# Patient Record
Sex: Female | Born: 1985 | Race: White | Hispanic: No | Marital: Married | Smoking: Never smoker
Health system: Southern US, Community
[De-identification: ages and names within clinical notes are randomized; demographics above are authoritative.]

## PROBLEM LIST (undated history)

## (undated) DIAGNOSIS — Z8041 Family history of malignant neoplasm of ovary: Secondary | ICD-10-CM

## (undated) DIAGNOSIS — Z789 Other specified health status: Secondary | ICD-10-CM

## (undated) DIAGNOSIS — Z9289 Personal history of other medical treatment: Secondary | ICD-10-CM

## (undated) HISTORY — DX: Family history of malignant neoplasm of ovary: Z80.41

## (undated) HISTORY — PX: TONSILLECTOMY: SUR1361

## (undated) HISTORY — PX: FOREIGN BODY REMOVAL: SHX962

---

## 2019-06-21 ENCOUNTER — Encounter: Payer: Self-pay | Admitting: Maternal Newborn

## 2019-06-21 ENCOUNTER — Ambulatory Visit (INDEPENDENT_AMBULATORY_CARE_PROVIDER_SITE_OTHER): Payer: BC Managed Care – PPO | Admitting: Maternal Newborn

## 2019-06-21 ENCOUNTER — Other Ambulatory Visit (HOSPITAL_COMMUNITY)
Admission: RE | Admit: 2019-06-21 | Discharge: 2019-06-21 | Disposition: A | Discharge status: Home / Self Care | Payer: BC Managed Care – PPO | Source: Ambulatory Visit | Attending: Maternal Newborn | Admitting: Maternal Newborn

## 2019-06-21 ENCOUNTER — Other Ambulatory Visit: Payer: Self-pay

## 2019-06-21 ENCOUNTER — Other Ambulatory Visit: Payer: Self-pay | Admitting: Maternal Newborn

## 2019-06-21 VITALS — BP 120/80 | Ht 63.0 in | Wt 169.0 lb

## 2019-06-21 DIAGNOSIS — Z124 Encounter for screening for malignant neoplasm of cervix: Secondary | ICD-10-CM | POA: Diagnosis present

## 2019-06-21 DIAGNOSIS — Z113 Encounter for screening for infections with a predominantly sexual mode of transmission: Secondary | ICD-10-CM | POA: Diagnosis present

## 2019-06-21 DIAGNOSIS — Z3201 Encounter for pregnancy test, result positive: Secondary | ICD-10-CM | POA: Diagnosis not present

## 2019-06-21 DIAGNOSIS — Z3401 Encounter for supervision of normal first pregnancy, first trimester: Secondary | ICD-10-CM | POA: Diagnosis not present

## 2019-06-21 DIAGNOSIS — Z3A08 8 weeks gestation of pregnancy: Secondary | ICD-10-CM | POA: Diagnosis not present

## 2019-06-21 DIAGNOSIS — Z3400 Encounter for supervision of normal first pregnancy, unspecified trimester: Secondary | ICD-10-CM | POA: Insufficient documentation

## 2019-06-21 DIAGNOSIS — Z369 Encounter for antenatal screening, unspecified: Secondary | ICD-10-CM

## 2019-06-21 LAB — POCT URINE PREGNANCY: Preg Test, Ur: POSITIVE — AB

## 2019-06-21 NOTE — Progress Notes (Signed)
06/21/2019   Chief Complaint: Desires prenatal care.  Transfer of Care Patient: no  History of Present Illness: Mia Williams is a 33 y.o. G1P0 at 63w5dbased on Patient's last menstrual period on 04/21/2019, with an Estimated Date of Delivery: 01/26/2020, with the above CC.   Her periods were: regular periods every month, lasting 5-7 days She has Positive signs or symptoms of nausea/vomiting of pregnancy. She has Negative signs or symptoms of miscarriage or preterm labor She identifies Negative Zika risk factors for her and her partner On any different medications around the time she conceived/early pregnancy: No  History of varicella: Yes   Review of Systems  Constitutional:       Change in appetite  HENT: Negative.   Eyes: Negative.   Respiratory: Negative for shortness of breath and wheezing.   Cardiovascular: Negative for chest pain and palpitations.  Gastrointestinal: Positive for nausea. Negative for abdominal pain and vomiting.  Genitourinary: Positive for dysuria and frequency. Negative for flank pain and hematuria.  Musculoskeletal: Negative.   Skin: Negative.   Neurological: Positive for headaches.       Tension headaches, more frequent now in pregnancy  Endo/Heme/Allergies: Negative.   Psychiatric/Behavioral: Negative.   Breasts: Positive for breast tenderness. Negative for new or changing breast lumps.  Review of systems was otherwise negative, except as stated in the above HPI.  OBGYN History: As per HPI. OB History  Gravida Para Term Preterm AB Living  1            SAB TAB Ectopic Multiple Live Births               # Outcome Date GA Lbr Len/2nd Weight Sex Delivery Anes PTL Lv  1 Current             Any issues with any prior pregnancies: not applicable Any prior children are healthy, doing well, without any problems or issues: not applicable History of pap smears: Yes. Last pap smear: unknown. Abnormal: no  History of STIs: No   Past Medical History: History  reviewed. No pertinent past medical history.  Past Surgical History: History reviewed. No pertinent surgical history.  Family History:  Family History  Problem Relation Age of Onset  . Ovarian cancer Paternal Grandmother   . Pancreatic cancer Paternal Grandfather    She denies any female cancers, bleeding or blood clotting disorders.  She denies any history of intellectual disability, birth defects or genetic disorders in her or the FOB's history  Social History:  Social History   Socioeconomic History  . Marital status: Single    Spouse name: Not on file  . Number of children: Not on file  . Years of education: Not on file  . Highest education level: Not on file  Occupational History  . Not on file  Social Needs  . Financial resource strain: Not on file  . Food insecurity    Worry: Not on file    Inability: Not on file  . Transportation needs    Medical: Not on file    Non-medical: Not on file  Tobacco Use  . Smoking status: Never Smoker  . Smokeless tobacco: Never Used  Substance and Sexual Activity  . Alcohol use: Not Currently  . Drug use: Never  . Sexual activity: Yes  Lifestyle  . Physical activity    Days per week: Not on file    Minutes per session: Not on file  . Stress: Not on file  Relationships  . Social  connections    Talks on phone: Not on file    Gets together: Not on file    Attends religious service: Not on file    Active member of club or organization: Not on file    Attends meetings of clubs or organizations: Not on file    Relationship status: Not on file  . Intimate partner violence    Fear of current or ex partner: Not on file    Emotionally abused: Not on file    Physically abused: Not on file    Forced sexual activity: Not on file  Other Topics Concern  . Not on file  Social History Narrative  . Not on file   Any cats in the household: no Domestic violence screening is negative.  Allergy: No Known Allergies  Current  Outpatient Medications: No current outpatient medications on file.   Physical Exam:   BP 120/80   Ht 5' 3"  (1.6 m)   Wt 169 lb (76.7 kg)   LMP 04/21/2019   BMI 29.94 kg/m  Body mass index is 29.94 kg/m. Constitutional: Well nourished, well developed female in no acute distress.  Neck:  Supple, normal appearance, and no thyromegaly  Cardiovascular: S1, S2 normal, no murmur, rub or gallop, regular rate and rhythm Respiratory:  Clear to auscultation bilaterally. Normal respiratory effort Abdomen:Mo masses, hernias; diffusely non tender to palpation, non distended Breasts: Patient declines to have breast exam. Neuro/Psych:  Normal mood and affect.  Skin:  Warm and dry.  Lymphatic:  No inguinal lymphadenopathy.   Pelvic exam: is not limited by body habitus External genitalia, Bartholin's glands, Urethra, Skene's glands: within normal limits Vagina: within normal limits and with no blood in the vault  Cervix: normal appearing cervix without discharge or lesions, closed/long/high Uterus:  enlarged, c/w early pregnancy Adnexa:  no mass, fullness, tenderness  Assessment: Mia Williams is a 33 y.o. G1P0 at 23w5dbased on Patient's last menstrual period on 04/21/2019. with an Estimated Date of Delivery: 01/26/2020, presenting for prenatal care.  Plan:  1) Avoid alcoholic beverages. 2) Patient encouraged not to smoke.  3) Discontinue the use of all non-medicinal drugs and chemicals.  4) Take prenatal vitamins daily.  5) Seatbelt use advised. 6) Nutrition, food safety (fish, cheese advisories, and high nitrite foods) and exercise discussed. 7) [facility]  8) Patient is asked about travel to areas at risk for the ZSegundovirus, and counseled to avoid travel and exposure to mosquitoes or partners who may have themselves been exposed to the virus.  9) [facility]. 10) Genetic Screening, such as with 1st Trimester Screening, cell  free fetal DNA, AFP testing, and Ultrasound is discussed with patient. She is considering genetic testing this pregnancy. 11) Discussed Diclegis/Bonjesta for daily, all-day nausea. Prefers to try Unisom and B6, written information given about dosage. 12) Prenatal vitamin samples given. 13) Pap done today. 14) Dating scan ordered. 17 Myriad testing offered for family history of ovarian cancer in her paternal grandmother. Written information given, and she is deciding whether to test.  Problem list reviewed and updated.  JAvel Sensor CNM [facility] 06/21/2019  11:53 AM

## 2019-06-21 NOTE — Addendum Note (Signed)
Addended by: Quintella Baton D on: 06/21/2019 01:36 PM   Modules accepted: Orders

## 2019-06-21 NOTE — Patient Instructions (Signed)
First Trimester of Pregnancy The first trimester of pregnancy is from week 1 until the end of week 13 (months 1 through 3). A week after a sperm fertilizes an egg, the egg will implant on the wall of the uterus. This embryo will begin to develop into a baby. Genes from you and your partner will form the baby. The female genes will determine whether the baby will be a boy or a girl. At 6-8 weeks, the eyes and face will be formed, and the heartbeat can be seen on ultrasound. At the end of 12 weeks, all the baby's organs will be formed. Now that you are pregnant, you will want to do everything you can to have a healthy baby. Two of the most important things are to get good prenatal care and to follow your health care provider's instructions. Prenatal care is all the medical care you receive before the baby's birth. This care will help prevent, find, and treat any problems during the pregnancy and childbirth. Body changes during your first trimester Your body goes through many changes during pregnancy. The changes vary from woman to woman.  You may gain or lose a couple of pounds at first.  You may feel sick to your stomach (nauseous) and you may throw up (vomit). If the vomiting is uncontrollable, call your health care provider.  You may tire easily.  You may develop headaches that can be relieved by medicines. All medicines should be approved by your health care provider.  You may urinate more often. Painful urination may mean you have a bladder infection.  You may develop heartburn as a result of your pregnancy.  You may develop constipation because certain hormones are causing the muscles that push stool through your intestines to slow down.  You may develop hemorrhoids or swollen veins (varicose veins).  Your breasts may begin to grow larger and become tender. Your nipples may stick out more, and the tissue that surrounds them (areola) may become darker.  Your gums may bleed and may be  sensitive to brushing and flossing.  Dark spots or blotches (chloasma, mask of pregnancy) may develop on your face. This will likely fade after the baby is born.  Your menstrual periods will stop.  You may have a loss of appetite.  You may develop cravings for certain kinds of food.  You may have changes in your emotions from day to day, such as being excited to be pregnant or being concerned that something may go wrong with the pregnancy and baby.  You may have more vivid and strange dreams.  You may have changes in your hair. These can include thickening of your hair, rapid growth, and changes in texture. Some women also have hair loss during or after pregnancy, or hair that feels dry or thin. Your hair will most likely return to normal after your baby is born. What to expect at prenatal visits During a routine prenatal visit:  You will be weighed to make sure you and the baby are growing normally.  Your blood pressure will be taken.  Your abdomen will be measured to track your baby's growth.  The fetal heartbeat will be listened to between weeks 10 and 14 of your pregnancy.  Test results from any previous visits will be discussed. Your health care provider may ask you:  How you are feeling.  If you are feeling the baby move.  If you have had any abnormal symptoms, such as leaking fluid, bleeding, severe headaches, or abdominal   cramping.  If you are using any tobacco products, including cigarettes, chewing tobacco, and electronic cigarettes.  If you have any questions. Other tests that may be performed during your first trimester include:  Blood tests to find your blood type and to check for the presence of any previous infections. The tests will also be used to check for low iron levels (anemia) and protein on red blood cells (Rh antibodies). Depending on your risk factors, or if you previously had diabetes during pregnancy, you may have tests to check for high blood sugar  that affects pregnant women (gestational diabetes).  Urine tests to check for infections, diabetes, or protein in the urine.  An ultrasound to confirm the proper growth and development of the baby.  Fetal screens for spinal cord problems (spina bifida) and Down syndrome.  HIV (human immunodeficiency virus) testing. Routine prenatal testing includes screening for HIV, unless you choose not to have this test.  You may need other tests to make sure you and the baby are doing well. Follow these instructions at home: Medicines  Follow your health care provider's instructions regarding medicine use. Specific medicines may be either safe or unsafe to take during pregnancy.  Take a prenatal vitamin that contains at least 600 micrograms (mcg) of folic acid.  If you develop constipation, try taking a stool softener if your health care provider approves. Eating and drinking   Eat a balanced diet that includes fresh fruits and vegetables, whole grains, good sources of protein such as meat, eggs, or tofu, and low-fat dairy. Your health care provider will help you determine the amount of weight gain that is right for you.  Avoid raw meat and uncooked cheese. These carry germs that can cause birth defects in the baby.  Eating four or five small meals rather than three large meals a day may help relieve nausea and vomiting. If you start to feel nauseous, eating a few soda crackers can be helpful. Drinking liquids between meals, instead of during meals, also seems to help ease nausea and vomiting.  Limit foods that are high in fat and processed sugars, such as fried and sweet foods.  To prevent constipation: ? Eat foods that are high in fiber, such as fresh fruits and vegetables, whole grains, and beans. ? Drink enough fluid to keep your urine clear or pale yellow. Activity  Exercise only as directed by your health care provider. Most women can continue their usual exercise routine during  pregnancy. Try to exercise for 30 minutes at least 5 days a week. Exercising will help you: ? Control your weight. ? Stay in shape. ? Be prepared for labor and delivery.  Experiencing pain or cramping in the lower abdomen or lower back is a good sign that you should stop exercising. Check with your health care provider before continuing with normal exercises.  Try to avoid standing for long periods of time. Move your legs often if you must stand in one place for a long time.  Avoid heavy lifting.  Wear low-heeled shoes and practice good posture.  You may continue to have sex unless your health care provider tells you not to. Relieving pain and discomfort  Wear a good support bra to relieve breast tenderness.  Take warm sitz baths to soothe any pain or discomfort caused by hemorrhoids. Use hemorrhoid cream if your health care provider approves.  Rest with your legs elevated if you have leg cramps or low back pain.  If you develop varicose veins in   your legs, wear support hose. Elevate your feet for 15 minutes, 3-4 times a day. Limit salt in your diet. Prenatal care  Schedule your prenatal visits by the twelfth week of pregnancy. They are usually scheduled monthly at first, then more often in the last 2 months before delivery.  Write down your questions. Take them to your prenatal visits.  Keep all your prenatal visits as told by your health care provider. This is important. Safety  Wear your seat belt at all times when driving.  Make a list of emergency phone numbers, including numbers for family, friends, the hospital, and police and fire departments. General instructions  Ask your health care provider for a referral to a local prenatal education class. Begin classes no later than the beginning of month 6 of your pregnancy.  Ask for help if you have counseling or nutritional needs during pregnancy. Your health care provider can offer advice or refer you to specialists for help  with various needs.  Do not use hot tubs, steam rooms, or saunas.  Do not douche or use tampons or scented sanitary pads.  Do not cross your legs for long periods of time.  Avoid cat litter boxes and soil used by cats. These carry germs that can cause birth defects in the baby and possibly loss of the fetus by miscarriage or stillbirth.  Avoid all smoking, herbs, alcohol, and medicines not prescribed by your health care provider. Chemicals in these products affect the formation and growth of the baby.  Do not use any products that contain nicotine or tobacco, such as cigarettes and e-cigarettes. If you need help quitting, ask your health care provider. You may receive counseling support and other resources to help you quit.  Schedule a dentist appointment. At home, brush your teeth with a soft toothbrush and be gentle when you floss. Contact a health care provider if:  You have dizziness.  You have mild pelvic cramps, pelvic pressure, or nagging pain in the abdominal area.  You have persistent nausea, vomiting, or diarrhea.  You have a bad smelling vaginal discharge.  You have pain when you urinate.  You notice increased swelling in your face, hands, legs, or ankles.  You are exposed to fifth disease or chickenpox.  You are exposed to German measles (rubella) and have never had it. Get help right away if:  You have a fever.  You are leaking fluid from your vagina.  You have spotting or bleeding from your vagina.  You have severe abdominal cramping or pain.  You have rapid weight gain or loss.  You vomit blood or material that looks like coffee grounds.  You develop a severe headache.  You have shortness of breath.  You have any kind of trauma, such as from a fall or a car accident. Summary  The first trimester of pregnancy is from week 1 until the end of week 13 (months 1 through 3).  Your body goes through many changes during pregnancy. The changes vary from  woman to woman.  You will have routine prenatal visits. During those visits, your health care provider will examine you, discuss any test results you may have, and talk with you about how you are feeling. This information is not intended to replace advice given to you by your health care provider. Make sure you discuss any questions you have with your health care provider. Document Released: 11/17/2001 Document Revised: 11/05/2017 Document Reviewed: 11/04/2016 Elsevier Patient Education  2020 [employer]  

## 2019-06-22 LAB — RPR+RH+ABO+RUB AB+AB SCR+CB...
Antibody Screen: NEGATIVE
HIV Screen 4th Generation wRfx: NONREACTIVE
Hematocrit: 37.2 % (ref 34.0–46.6)
Hemoglobin: 12.4 g/dL (ref 11.1–15.9)
Hepatitis B Surface Ag: NEGATIVE
MCH: 29.4 pg (ref 26.6–33.0)
MCHC: 33.3 g/dL (ref 31.5–35.7)
MCV: 88 fL (ref 79–97)
Platelets: 231 10*3/uL (ref 150–450)
RBC: 4.22 x10E6/uL (ref 3.77–5.28)
RDW: 13.2 % (ref 11.7–15.4)
RPR Ser Ql: NONREACTIVE
Rh Factor: POSITIVE
Rubella Antibodies, IGG: 1.74 index (ref >0.99)
Varicella zoster IgG: >4000 index (ref >165)
WBC: 8.2 10*3/uL (ref 3.4–10.8)

## 2019-06-22 LAB — URINE DRUG PANEL 7
Amphetamines, Urine: NEGATIVE ng/mL
Barbiturate Quant, Ur: NEGATIVE ng/mL
Benzodiazepine Quant, Ur: NEGATIVE ng/mL
Cannabinoid Quant, Ur: NEGATIVE ng/mL
Cocaine (Metab.): NEGATIVE ng/mL
Opiate Quant, Ur: NEGATIVE ng/mL
PCP Quant, Ur: NEGATIVE ng/mL

## 2019-06-23 LAB — URINE CULTURE

## 2019-06-23 LAB — CYTOLOGY - PAP
Chlamydia: NEGATIVE
Diagnosis: NEGATIVE
HPV: NOT DETECTED
Neisseria Gonorrhea: NEGATIVE

## 2019-06-29 ENCOUNTER — Other Ambulatory Visit: Payer: Self-pay

## 2019-06-29 ENCOUNTER — Ambulatory Visit (INDEPENDENT_AMBULATORY_CARE_PROVIDER_SITE_OTHER): Payer: BC Managed Care – PPO

## 2019-06-29 ENCOUNTER — Ambulatory Visit (INDEPENDENT_AMBULATORY_CARE_PROVIDER_SITE_OTHER): Payer: BC Managed Care – PPO | Admitting: Certified Nurse Midwife

## 2019-06-29 VITALS — BP 130/80 | Wt 169.0 lb

## 2019-06-29 DIAGNOSIS — N8311 Corpus luteum cyst of right ovary: Secondary | ICD-10-CM

## 2019-06-29 DIAGNOSIS — Z369 Encounter for antenatal screening, unspecified: Secondary | ICD-10-CM

## 2019-06-29 DIAGNOSIS — O26841 Uterine size-date discrepancy, first trimester: Secondary | ICD-10-CM | POA: Diagnosis not present

## 2019-06-29 DIAGNOSIS — Z3A01 Less than 8 weeks gestation of pregnancy: Secondary | ICD-10-CM | POA: Diagnosis not present

## 2019-06-29 DIAGNOSIS — O3481 Maternal care for other abnormalities of pelvic organs, first trimester: Secondary | ICD-10-CM

## 2019-06-29 DIAGNOSIS — Z3400 Encounter for supervision of normal first pregnancy, unspecified trimester: Secondary | ICD-10-CM

## 2019-06-29 NOTE — Progress Notes (Signed)
33 year old G1 P0 with EDC=01/25/2019 presents at [facility] after dating scan. CRL c/w [id number]. No FCA seen. Patient reports + pregnancy test 3 weeks ago. Menses usually monthly Advised that she is either not as far along as thought, or that there is an intrauterine fetal demise. Will get another viability scan for 10-14 days and follow up at that time SAB precautions  Mia Williams, CNM

## 2019-07-05 ENCOUNTER — Telehealth: Payer: Self-pay

## 2019-07-05 DIAGNOSIS — O209 Hemorrhage in early pregnancy, unspecified: Secondary | ICD-10-CM

## 2019-07-05 NOTE — Telephone Encounter (Signed)
Called and left message. Will call tomorrow to schedule an earlier ultrasound. Advised to go to ER with heavy bleeding of needing 3 pads or more an hour for 2 hours.

## 2019-07-05 NOTE — Telephone Encounter (Signed)
Pt was seen 06/29/2019 & notified she is either much earlier than anticipated or it was not a viable pregnancy. She has a f/u on 07/13/2019 & was to call if she had any further bleeding. She started bleeding last night. It has gotten heavier throughout today. She's sure this means it is a miscarriage. She wanted to make [place] aware & see what her next steps should be. PJ#[phone]

## 2019-07-06 NOTE — Telephone Encounter (Signed)
Called patient today. Started bleeding like a heavy period with cramping 2 days ago, bleeding has slowed down. Will schedule for an ultrasound and follow up with me tomorrow. Dalia Heading, CNM

## 2019-07-07 ENCOUNTER — Encounter: Payer: Self-pay | Admitting: Emergency Medicine

## 2019-07-07 ENCOUNTER — Other Ambulatory Visit: Payer: Self-pay

## 2019-07-07 ENCOUNTER — Emergency Department
Admission: EM | Admit: 2019-07-07 | Discharge: 2019-07-07 | Disposition: A | Discharge status: Home / Self Care | Payer: BC Managed Care – PPO | Attending: Emergency Medicine | Admitting: Emergency Medicine

## 2019-07-07 ENCOUNTER — Other Ambulatory Visit: Payer: BC Managed Care – PPO

## 2019-07-07 ENCOUNTER — Emergency Department: Payer: BC Managed Care – PPO

## 2019-07-07 ENCOUNTER — Encounter: Payer: BC Managed Care – PPO | Admitting: Certified Nurse Midwife

## 2019-07-07 DIAGNOSIS — O034 Incomplete spontaneous abortion without complication: Secondary | ICD-10-CM | POA: Insufficient documentation

## 2019-07-07 DIAGNOSIS — O3680X0 Pregnancy with inconclusive fetal viability, not applicable or unspecified: Secondary | ICD-10-CM

## 2019-07-07 DIAGNOSIS — O209 Hemorrhage in early pregnancy, unspecified: Secondary | ICD-10-CM | POA: Diagnosis present

## 2019-07-07 LAB — CBC WITH DIFFERENTIAL/PLATELET
Abs Immature Granulocytes: 0.06 10*3/uL (ref 0.00–0.07)
Basophils Absolute: 0.1 10*3/uL (ref 0.0–0.1)
Basophils Relative: 0 %
Eosinophils Absolute: 0.1 10*3/uL (ref 0.0–0.5)
Eosinophils Relative: 1 %
HCT: 36.3 % (ref 36.0–46.0)
Hemoglobin: 11.9 g/dL — ABNORMAL LOW (ref 12.0–15.0)
Immature Granulocytes: 0 %
Lymphocytes Relative: 12 %
Lymphs Abs: 1.7 10*3/uL (ref 0.7–4.0)
MCH: 29.8 pg (ref 26.0–34.0)
MCHC: 32.8 g/dL (ref 30.0–36.0)
MCV: 90.8 fL (ref 80.0–100.0)
Monocytes Absolute: 0.5 10*3/uL (ref 0.1–1.0)
Monocytes Relative: 4 %
Neutro Abs: 11.7 10*3/uL — ABNORMAL HIGH (ref 1.7–7.7)
Neutrophils Relative %: 83 %
Platelets: 218 10*3/uL (ref 150–400)
RBC: 4 MIL/uL (ref 3.87–5.11)
RDW: 13.3 % (ref 11.5–15.5)
WBC: 14.1 10*3/uL — ABNORMAL HIGH (ref 4.0–10.5)
nRBC: 0 % (ref 0.0–0.2)

## 2019-07-07 LAB — BASIC METABOLIC PANEL
Anion gap: 10 (ref 5–15)
BUN: 14 mg/dL (ref 6–20)
CO2: 20 mmol/L — ABNORMAL LOW (ref 22–32)
Calcium: 8.8 mg/dL — ABNORMAL LOW (ref 8.9–10.3)
Chloride: 110 mmol/L (ref 98–111)
Creatinine, Ser: 0.7 mg/dL (ref 0.44–1.00)
GFR calc Af Amer: >60 mL/min (ref >60)
GFR calc non Af Amer: >60 mL/min (ref >60)
Glucose, Bld: 122 mg/dL — ABNORMAL HIGH (ref 70–99)
Potassium: 3.5 mmol/L (ref 3.5–5.1)
Sodium: 140 mmol/L (ref 135–145)

## 2019-07-07 LAB — TYPE AND SCREEN
ABO/RH(D): A POS
Antibody Screen: NEGATIVE

## 2019-07-07 LAB — HCG, QUANTITATIVE, PREGNANCY: hCG, Beta Chain, Quant, S: 6099 m[IU]/mL — ABNORMAL HIGH (ref <5)

## 2019-07-07 MED ORDER — MISOPROSTOL 200 MCG PO TABS: ORAL_TABLET | ORAL | 0 refills | Status: DC

## 2019-07-07 MED ORDER — MISOPROSTOL 200 MCG PO TABS: 800.0000 ug | ORAL_TABLET | Freq: Four times a day (QID) | ORAL | 0 refills | Status: DC

## 2019-07-07 MED ORDER — ONDANSETRON HCL 4 MG/2ML IJ SOLN
4.0000 mg | Freq: Once | INTRAMUSCULAR | Status: AC
  Administered 2019-07-07: 11:00:00 4 mg via INTRAVENOUS
  Filled 2019-07-07: qty 2

## 2019-07-07 MED ORDER — OXYCODONE-ACETAMINOPHEN 5-325 MG PO TABS: 1.0000 | ORAL_TABLET | ORAL | 0 refills | Status: AC | PRN

## 2019-07-07 MED ORDER — MORPHINE SULFATE (PF) 4 MG/ML IV SOLN
4.0000 mg | Freq: Once | INTRAVENOUS | Status: AC
  Administered 2019-07-07: 11:00:00 4 mg via INTRAVENOUS
  Filled 2019-07-07: qty 1

## 2019-07-07 NOTE — ED Triage Notes (Signed)
Pt to ED via POV, pt states that she is miscarrying and is having increased pain and vaginal bleeding. Pt states that she has used 3 pads since this morning around 0530. Pt appears uncomfortable in triage.

## 2019-07-07 NOTE — ED Provider Notes (Signed)
[facility] Emergency Department Provider Note ____________________________________________   First MD Initiated Contact with Patient 07/07/19 1045     (approximate)  I have reviewed the triage vital signs and the nursing notes.   HISTORY  Chief Complaint Abdominal Pain and Vaginal Bleeding    HPI Mia Williams is a 33 y.o. female G1P0 at approximately 9 weeks pregnancy by dates with PMH as noted below who presents with vaginal bleeding over the last 2 days, acutely worsened this morning, requiring her to use 4 pads in the span of approximately 4 hours.  She reports bilateral suprapubic area lower abdominal pain and some associated nausea and vomiting.   History reviewed. No pertinent past medical history.  Patient Active Problem List   Diagnosis Date Noted  . Supervision of normal first pregnancy, antepartum 06/21/2019    History reviewed. No pertinent surgical history.  Prior to Admission medications   Medication Sig Start Date End Date Taking? Authorizing Provider  misoprostol (CYTOTEC) 200 MCG tablet 800mcg (4 tablets) buccally every 6 hours for up to 3 doses 07/07/19   Dionne BucySiadecki, Renelle Stegenga, MD  oxyCODONE-acetaminophen (PERCOCET) 5-325 MG tablet Take 1 tablet by mouth every 4 (four) hours as needed for up to 3 days for severe pain. 07/07/19 07/10/19  Dionne BucySiadecki, Niya Behler, MD    Allergies Patient has no known allergies.  Family History  Problem Relation Age of Onset  . Ovarian cancer Paternal Grandmother   . Pancreatic cancer Paternal Grandfather     Social History Social History   Tobacco Use  . Smoking status: Never Smoker  . Smokeless tobacco: Never Used  Substance Use Topics  . Alcohol use: Not Currently  . Drug use: Never    Review of Systems  Constitutional: No fever. Eyes: No redness. ENT: No sore throat. Cardiovascular: Denies chest pain. Respiratory: Denies shortness of breath. Gastrointestinal: Positive for nausea  vomiting. Genitourinary: Positive for vaginal bleeding. Musculoskeletal: Positive for back pain. Skin: Negative for rash. Neurological: Negative for headache.   ____________________________________________   PHYSICAL EXAM:  VITAL SIGNS: ED Triage Vitals  Enc Vitals Group     BP 07/07/19 0955 110/64     Pulse Rate 07/07/19 0955 (!) 59     Resp 07/07/19 0955 (!) 22     Temp --      Temp Source 07/07/19 0955 Oral     SpO2 07/07/19 0955 99 %     Weight 07/07/19 0958 165 lb (74.8 kg)     Height 07/07/19 0958 5\' 3"  (1.6 m)     Head Circumference --      Peak Flow --      Pain Score 07/07/19 0957 10     Pain Loc --      Pain Edu? --      Excl. in GC? --     Constitutional: Alert and oriented.  Uncomfortable appearing but in no acute distress. Eyes: Conjunctivae are normal.  Head: Atraumatic. Nose: No congestion/rhinnorhea. Mouth/Throat: Mucous membranes are moist.   Neck: Normal range of motion.  Cardiovascular: Normal rate, regular rhythm. Grossly normal heart sounds.   Respiratory: Normal respiratory effort.  No retractions.  Gastrointestinal: Soft with mild suprapubic tenderness.  No distention.  Genitourinary: Normal external genitalia.  No significant active hemorrhage.  Cervical os fingertip dilated. Musculoskeletal: No lower extremity edema.  Extremities warm and well perfused.  Neurologic:  Normal speech and language. No gross focal neurologic deficits are appreciated.  Skin:  Skin is warm and dry. No rash  noted. Psychiatric: Mood and affect are normal. Speech and behavior are normal.  ____________________________________________   LABS (all labs ordered are listed, but only abnormal results are displayed)  Labs Reviewed  CBC WITH DIFFERENTIAL/PLATELET - Abnormal; Notable for the following components:      Result Value   WBC 14.1 (*)    Hemoglobin 11.9 (*)    Neutro Abs 11.7 (*)    All other components within normal limits  BASIC METABOLIC PANEL - Abnormal;  Notable for the following components:   CO2 20 (*)    Glucose, Bld 122 (*)    Calcium 8.8 (*)    All other components within normal limits  HCG, QUANTITATIVE, PREGNANCY - Abnormal; Notable for the following components:   hCG, Beta Chain, Quant, S 6,099 (*)    All other components within normal limits  POC URINE PREG, ED  TYPE AND SCREEN   ____________________________________________  EKG   ____________________________________________  RADIOLOGY  US pelvis: Gestational sac in the area of the cervix suggestive of aborting pregnancy  ____________________________________________   PROCEDURES  Procedure(s) performed: No  Procedures  Critical Care performed: No ____________________________________________   INITIAL IMPRESSION / ASSESSMENT AND PLAN / ED COURSE  Pertinent labs & imaging results that were available during my care of the patient were reviewed by me and considered in my medical decision making (see chart for details).  33 year old female G1 P0 approximately 9 weeks from her LMP presents with heavy vaginal bleeding and lower abdominal pain over the last 2 days, acutely worsened today.  I reviewed the past medical records in epic.  The patient follows at [place].  Dating scan from 06/29/2019 is suggestive of approximately 6 weeks.  There was suspicion for intrauterine fetal demise.  On exam the patient is uncomfortable but overall relatively well-appearing.  Her vital signs are normal.  She has mild suprapubic tenderness.  Presentation is most consistent with spontaneous abortion.  We will obtain labs, ultrasound, and reassess.  ----------------------------------------- 3:04 PM on 07/07/2019 -----------------------------------------  Lab work-up is reassuring.  The ultrasound reveals a gestational sac in the cervix suggestive of incomplete abortion.  The patient's pain has significantly subsided.  I consulted Dr. Nechama Guard from OB/GYN who advised that we  could offer discharge with Cytotec versus observing the patient and possible D&C later today, based on the patient's preference and comfort level.  I discussed this with the patient including risks and benefits of both options.  The patient would prefer to go home and take the Cytotec, and return if she had severe intractable bleeding or pain.  The patient was stable at the time of discharge.  I counseled her on the results of the work-up.  I gave her the return precautions and she expressed understanding.  ____________________________________________   FINAL CLINICAL IMPRESSION(S) / ED DIAGNOSES  Final diagnoses:  Incomplete miscarriage      NEW MEDICATIONS STARTED DURING THIS VISIT:  Discharge Medication List as of 07/07/2019  2:12 PM    START taking these medications   Details  oxyCODONE-acetaminophen (PERCOCET) 5-325 MG tablet Take 1 tablet by mouth every 4 (four) hours as needed for up to 3 days for severe pain., Starting Fri 07/07/2019, Until Mon 07/10/2019, Normal         Note:  This document was prepared using Dragon voice recognition software and may include unintentional dictation errors.    Arta Silence, MD 07/07/19 1505

## 2019-07-07 NOTE — Discharge Instructions (Addendum)
Take the Cytotec as prescribed, 4 pills buccally (in the cheeks) once and then again 6 hours later if you have not passed all the tissue.  You can take up to 3 doses.  Return to the ER for new, worsening, or persistent severe pain, bleeding, weakness or lightheadedness, or any other new or worsening symptoms that concern you.  Follow-up with your OB/GYN as instructed.

## 2019-07-13 ENCOUNTER — Encounter: Payer: Self-pay | Admitting: Obstetrics and Gynecology

## 2019-07-13 ENCOUNTER — Ambulatory Visit (INDEPENDENT_AMBULATORY_CARE_PROVIDER_SITE_OTHER): Payer: BC Managed Care – PPO | Admitting: Obstetrics and Gynecology

## 2019-07-13 ENCOUNTER — Ambulatory Visit (INDEPENDENT_AMBULATORY_CARE_PROVIDER_SITE_OTHER): Payer: BC Managed Care – PPO

## 2019-07-13 ENCOUNTER — Other Ambulatory Visit: Payer: Self-pay

## 2019-07-13 DIAGNOSIS — O3481 Maternal care for other abnormalities of pelvic organs, first trimester: Secondary | ICD-10-CM | POA: Diagnosis not present

## 2019-07-13 DIAGNOSIS — N8311 Corpus luteum cyst of right ovary: Secondary | ICD-10-CM | POA: Diagnosis not present

## 2019-07-13 DIAGNOSIS — O209 Hemorrhage in early pregnancy, unspecified: Secondary | ICD-10-CM | POA: Diagnosis not present

## 2019-07-13 DIAGNOSIS — Z3400 Encounter for supervision of normal first pregnancy, unspecified trimester: Secondary | ICD-10-CM

## 2019-07-13 DIAGNOSIS — O034 Incomplete spontaneous abortion without complication: Secondary | ICD-10-CM

## 2019-07-13 DIAGNOSIS — O26841 Uterine size-date discrepancy, first trimester: Secondary | ICD-10-CM

## 2019-07-13 NOTE — Progress Notes (Signed)
ROB/US No concerns   

## 2019-07-13 NOTE — H&P (View-Only) (Signed)
   Patient ID: Mia Williams, female   DOB: 05/29/1986, 32 y.o.   MRN: [id number]  Reason for Consult: Routine Prenatal Visit   Referred by No ref. provider found  Subjective:     HPI:  Mia Williams is a 32 y.o. female . She presents today for a follow up after an incomplete miscarriage. She was seen in the ED last week for heavy bleeding related to a miscarriage. She was given oral cytotec which she took Friday evening and Saturday morning. She has continued to have some bleeding and cramping since that time. US today shows retained POC.   History reviewed. No pertinent past medical history. Family History  Problem Relation Age of Onset  . Ovarian cancer Paternal Grandmother   . Pancreatic cancer Paternal Grandfather    History reviewed. No pertinent surgical history.  Short Social History:  Social History   Tobacco Use  . Smoking status: Never Smoker  . Smokeless tobacco: Never Used  Substance Use Topics  . Alcohol use: Not Currently    No Known Allergies  No current outpatient medications on file.   No current facility-administered medications for this visit.     REVIEW OF SYSTEMS      Objective:  Objective   Vitals:   07/13/19 1526  BP: 118/70  Weight: 167 lb (75.8 kg)   Body mass index is 29.58 kg/m.  Physical Exam Vitals signs and nursing note reviewed.  Constitutional:      Appearance: She is well-developed.  HENT:     Head: Normocephalic and atraumatic.  Eyes:     Pupils: Pupils are equal, round, and reactive to light.  Cardiovascular:     Rate and Rhythm: Normal rate and regular rhythm.  Pulmonary:     Effort: Pulmonary effort is normal. No respiratory distress.  Skin:    General: Skin is warm and dry.  Neurological:     Mental Status: She is alert and oriented to person, place, and time.  Psychiatric:        Behavior: Behavior normal.        Thought Content: Thought content normal.        Judgment: Judgment normal.           Assessment/Plan:     32 yo with incomplete miscarriage and retained POC.  Condolences regarding her miscarriage were offered.  Discussed options for expectant versus surgical management. She has already failed medical management. Discussed that 80 % of people will pass all of the pregnancy tissue by 8 weeks. She will consider and schedule a D&C if she desires.  Rh positive  More than 25 minutes were spent face to face with the patient in the room with more than 50% of the time spent providing counseling and discussing the plan of management.    Christanna Schuman MD [facility], [facility] 07/13/2019 5:55 PM   

## 2019-07-13 NOTE — Progress Notes (Signed)
   Patient ID: Mia Williams, female   DOB: 1986-04-13, 33 y.o.   MRN: [id number]  Reason for Consult: Routine Prenatal Visit   Referred by No ref. provider found  Subjective:     HPI:  Mia Williams is a 33 y.o. female . She presents today for a follow up after an incomplete miscarriage. She was seen in the ED last week for heavy bleeding related to a miscarriage. She was given oral cytotec which she took Friday evening and Saturday morning. She has continued to have some bleeding and cramping since that time. [place] today shows retained POC.   History reviewed. No pertinent past medical history. Family History  Problem Relation Age of Onset  . Ovarian cancer Paternal Grandmother   . Pancreatic cancer Paternal Grandfather    History reviewed. No pertinent surgical history.  Short Social History:  Social History   Tobacco Use  . Smoking status: Never Smoker  . Smokeless tobacco: Never Used  Substance Use Topics  . Alcohol use: Not Currently    No Known Allergies  No current outpatient medications on file.   No current facility-administered medications for this visit.     REVIEW OF SYSTEMS      Objective:  Objective   Vitals:   07/13/19 1526  BP: 118/70  Weight: 167 lb (75.8 kg)   Body mass index is 29.58 kg/m.  Physical Exam Vitals signs and nursing note reviewed.  Constitutional:      Appearance: She is well-developed.  HENT:     Head: Normocephalic and atraumatic.  Eyes:     Pupils: Pupils are equal, round, and reactive to light.  Cardiovascular:     Rate and Rhythm: Normal rate and regular rhythm.  Pulmonary:     Effort: Pulmonary effort is normal. No respiratory distress.  Skin:    General: Skin is warm and dry.  Neurological:     Mental Status: She is alert and oriented to person, place, and time.  Psychiatric:        Behavior: Behavior normal.        Thought Content: Thought content normal.        Judgment: Judgment normal.           Assessment/Plan:     33 yo with incomplete miscarriage and retained POC.  Condolences regarding her miscarriage were offered.  Discussed options for expectant versus surgical management. She has already failed medical management. Discussed that 80 % of people will pass all of the pregnancy tissue by 8 weeks. She will consider and schedule a D&C if she desires.  Rh positive  More than 25 minutes were spent face to face with the patient in the room with more than 50% of the time spent providing counseling and discussing the plan of management.    Adrian Prows MD [facility], [facility] 07/13/2019 5:55 PM

## 2019-07-14 ENCOUNTER — Telehealth: Payer: Self-pay | Admitting: Obstetrics and Gynecology

## 2019-07-14 NOTE — Telephone Encounter (Signed)
-----   Message from Homero Fellers, MD sent at 07/13/2019  4:11 PM EDT ----- Surgery Booking Request Patient Full Name:  Mia Williams  MRN: [id number]  DOB: 15-Aug-1986  Surgeon: Homero Fellers, MD  Requested Surgery Date and Time: 07/20/2019 Primary Diagnosis AND Code: retained products of conception Secondary Diagnosis and Code:  Surgical Procedure: D&E L&D Notification: No Admission Status: same day surgery Length of Surgery: 1 hours Special Case Needs: none H&P: TBD (date) Phone Interview???: yes Interpreter: Language:  Medical Clearance: no Special Scheduling Instructions: Please call, patient may want or not want to have the surgery- she is considering her options.  Acuity: P3

## 2019-07-14 NOTE — Telephone Encounter (Signed)
H&P and consents on day of surgery, per Dr. Gilman Schmidt. Patient is aware of Pre-admit testing phone interview to be scheduled (patient will watch for notification in [place]), Covid testing on Monday, 07/17/19 @ 8-10:30am, and OR on 07/20/19. Patient is aware she will be asked to quarantine after Covid testing. Patient is aware she may receive calls from the [facility] ([facility]). Patient confirmed BCBS and no secondary insurance. Patient is nervous about scarring and would like to discuss w/ Dr. Gilman Schmidt. Patient wants to know if okay for work following Monday.

## 2019-07-17 ENCOUNTER — Other Ambulatory Visit
Admission: RE | Admit: 2019-07-17 | Discharge: 2019-07-17 | Disposition: A | Discharge status: Home / Self Care | Payer: BC Managed Care – PPO | Source: Ambulatory Visit | Attending: Obstetrics and Gynecology | Admitting: Obstetrics and Gynecology

## 2019-07-17 ENCOUNTER — Other Ambulatory Visit: Payer: Self-pay

## 2019-07-17 DIAGNOSIS — Z20828 Contact with and (suspected) exposure to other viral communicable diseases: Secondary | ICD-10-CM | POA: Diagnosis not present

## 2019-07-17 DIAGNOSIS — Z01812 Encounter for preprocedural laboratory examination: Secondary | ICD-10-CM | POA: Diagnosis present

## 2019-07-18 LAB — SARS CORONAVIRUS 2 (TAT 6-24 HRS): SARS Coronavirus 2: NEGATIVE

## 2019-07-18 NOTE — Telephone Encounter (Signed)
Returned phone call and discussed. She feels comfortable proceeding.

## 2019-07-19 ENCOUNTER — Other Ambulatory Visit: Payer: Self-pay

## 2019-07-19 ENCOUNTER — Encounter
Admission: RE | Admit: 2019-07-19 | Discharge: 2019-07-19 | Disposition: A | Discharge status: Home / Self Care | Payer: BC Managed Care – PPO | Source: Ambulatory Visit | Attending: Obstetrics and Gynecology | Admitting: Obstetrics and Gynecology

## 2019-07-19 HISTORY — DX: Other specified health status: Z78.9

## 2019-07-19 HISTORY — DX: Personal history of other medical treatment: Z92.89

## 2019-07-19 NOTE — Patient Instructions (Signed)
Your procedure is scheduled on: 07/20/19 ARRIVE AT 12 NOON Report to Day Surgery. [facility] SECOND FLOOR   Remember: Instructions that are not followed completely may result in serious medical risk,  up to and including death, or upon the discretion of your surgeon and anesthesiologist your  surgery may need to be rescheduled.     _X__ 1. Do not eat food after midnight the night before your procedure.                 No gum chewing or hard candies. You may drink clear liquids up to 2 hours                 before you are scheduled to arrive for your surgery- DO not drink clear                 liquids within 2 hours of the start of your surgery.                 Clear Liquids include:  water, apple juice without pulp, clear carbohydrate                 drink such as Clearfast of Gatorade, Black Coffee or Tea (Do not add                 anything to coffee or tea).  __X__2.  On the morning of surgery brush your teeth with toothpaste and water, you                may rinse your mouth with mouthwash if you wish.  Do not swallow any toothpaste of mouthwash.     _X__ 3.  No Alcohol for 24 hours before or after surgery.   _X__ 4.  Do Not Smoke or use e-cigarettes For 24 Hours Prior to Your Surgery.                 Do not use any chewable tobacco products for at least 6 hours prior to                 surgery.  ____  5.  Bring all medications with you on the day of surgery if instructed.   _X___  6.  Notify your doctor if there is any change in your medical condition      (cold, fever, infections).     Do not wear jewelry, make-up, hairpins, clips or nail polish. Do not wear lotions, powders, or perfumes. You may wear deodorant. Do not shave 48 hours prior to surgery. Men may shave face and neck. Do not bring valuables to the hospital.    [facility] for any belongings or valuables.  Contacts, dentures or bridgework may not be worn into  surgery. Leave your suitcase in the car. After surgery it may be brought to your room. For patients admitted to the hospital, discharge time is determined by your treatment team.   Patients discharged the day of surgery will not be allowed to drive home.          ____ Take these medicines the morning of surgery with A SIP OF WATER:    1. NONE  2.   3.   4.  5.  6.  ____ Fleet Enema (as directed)   ____ Use CHG Soap as directed  ____ Use inhalers on the day of surgery  ____ Stop metformin 2 days prior to surgery    ____ Take 1/2  of usual insulin dose the night before surgery. No insulin the morning          of surgery.   ____ Stop Coumadin/Plavix/aspirin on   ____ Stop Anti-inflammatories on    ____ Stop supplements until after surgery.    ____ Bring C-Pap to the hospital.

## 2019-07-20 ENCOUNTER — Encounter
Admission: RE | Disposition: A | Discharge status: Home / Self Care | Payer: Self-pay | Source: Home / Self Care | Attending: Obstetrics and Gynecology

## 2019-07-20 ENCOUNTER — Encounter: Payer: Self-pay | Admitting: Anesthesiology

## 2019-07-20 ENCOUNTER — Ambulatory Visit
Admission: RE | Admit: 2019-07-20 | Discharge: 2019-07-20 | Disposition: A | Discharge status: Home / Self Care | Payer: BC Managed Care – PPO | Attending: Obstetrics and Gynecology | Admitting: Obstetrics and Gynecology

## 2019-07-20 ENCOUNTER — Ambulatory Visit: Payer: BC Managed Care – PPO | Admitting: Anesthesiology

## 2019-07-20 ENCOUNTER — Other Ambulatory Visit: Payer: Self-pay

## 2019-07-20 DIAGNOSIS — O034 Incomplete spontaneous abortion without complication: Secondary | ICD-10-CM | POA: Diagnosis present

## 2019-07-20 DIAGNOSIS — Z3A10 10 weeks gestation of pregnancy: Secondary | ICD-10-CM

## 2019-07-20 HISTORY — PX: DILATION AND EVACUATION: SHX1459

## 2019-07-20 LAB — CBC
HCT: 38.7 % (ref 36.0–46.0)
Hemoglobin: 12.6 g/dL (ref 12.0–15.0)
MCH: 29 pg (ref 26.0–34.0)
MCHC: 32.6 g/dL (ref 30.0–36.0)
MCV: 89.2 fL (ref 80.0–100.0)
Platelets: 251 10*3/uL (ref 150–400)
RBC: 4.34 MIL/uL (ref 3.87–5.11)
RDW: 12.9 % (ref 11.5–15.5)
WBC: 8.1 10*3/uL (ref 4.0–10.5)
nRBC: 0 % (ref 0.0–0.2)

## 2019-07-20 LAB — TYPE AND SCREEN
ABO/RH(D): A POS
Antibody Screen: NEGATIVE

## 2019-07-20 SURGERY — DILATION AND EVACUATION, UTERUS
Anesthesia: General | Site: Uterus

## 2019-07-20 MED ORDER — FAMOTIDINE 20 MG PO TABS
ORAL_TABLET | ORAL | Status: AC
  Filled 2019-07-20: qty 1

## 2019-07-20 MED ORDER — FAMOTIDINE 20 MG PO TABS
20.0000 mg | ORAL_TABLET | Freq: Once | ORAL | Status: AC
  Administered 2019-07-20: 13:00:00 20 mg via ORAL

## 2019-07-20 MED ORDER — LIDOCAINE HCL (PF) 2 % IJ SOLN
INTRAMUSCULAR | Status: AC
  Filled 2019-07-20: qty 10

## 2019-07-20 MED ORDER — PROPOFOL 10 MG/ML IV BOLUS
INTRAVENOUS | Status: AC
  Filled 2019-07-20: qty 20

## 2019-07-20 MED ORDER — MIDAZOLAM HCL 2 MG/2ML IJ SOLN
INTRAMUSCULAR | Status: DC | PRN
  Administered 2019-07-20: 2 mg via INTRAVENOUS

## 2019-07-20 MED ORDER — DOXYCYCLINE HYCLATE 100 MG IV SOLR
200.0000 mg | INTRAVENOUS | Status: AC
  Administered 2019-07-20: 200 mg via INTRAVENOUS
  Filled 2019-07-20: qty 200

## 2019-07-20 MED ORDER — FENTANYL CITRATE (PF) 100 MCG/2ML IJ SOLN
INTRAMUSCULAR | Status: AC
  Filled 2019-07-20: qty 2

## 2019-07-20 MED ORDER — EPHEDRINE SULFATE 50 MG/ML IJ SOLN
INTRAMUSCULAR | Status: AC
  Filled 2019-07-20: qty 1

## 2019-07-20 MED ORDER — MIDAZOLAM HCL 2 MG/2ML IJ SOLN
INTRAMUSCULAR | Status: AC
  Filled 2019-07-20: qty 2

## 2019-07-20 MED ORDER — FENTANYL CITRATE (PF) 100 MCG/2ML IJ SOLN
INTRAMUSCULAR | Status: AC
  Administered 2019-07-20: 25 ug via INTRAVENOUS
  Filled 2019-07-20: qty 2

## 2019-07-20 MED ORDER — LIDOCAINE HCL (CARDIAC) PF 100 MG/5ML IV SOSY
PREFILLED_SYRINGE | INTRAVENOUS | Status: DC | PRN
  Administered 2019-07-20: 80 mg via INTRAVENOUS

## 2019-07-20 MED ORDER — FENTANYL CITRATE (PF) 100 MCG/2ML IJ SOLN
INTRAMUSCULAR | Status: DC | PRN
  Administered 2019-07-20 (×3): 50 ug via INTRAVENOUS

## 2019-07-20 MED ORDER — FENTANYL CITRATE (PF) 100 MCG/2ML IJ SOLN
25.0000 ug | INTRAMUSCULAR | Status: DC | PRN
  Administered 2019-07-20 (×4): 25 ug via INTRAVENOUS

## 2019-07-20 MED ORDER — ONDANSETRON HCL 4 MG/2ML IJ SOLN
INTRAMUSCULAR | Status: AC
  Filled 2019-07-20: qty 2

## 2019-07-20 MED ORDER — IBUPROFEN 600 MG PO TABS: 600.0000 mg | ORAL_TABLET | Freq: Four times a day (QID) | ORAL | 0 refills | Status: DC | PRN

## 2019-07-20 MED ORDER — ACETAMINOPHEN 500 MG PO TABS: 1000.0000 mg | ORAL_TABLET | Freq: Four times a day (QID) | ORAL | 0 refills | Status: AC | PRN

## 2019-07-20 MED ORDER — KETOROLAC TROMETHAMINE 30 MG/ML IJ SOLN
INTRAMUSCULAR | Status: DC | PRN
  Administered 2019-07-20: 30 mg via INTRAVENOUS

## 2019-07-20 MED ORDER — TRAMADOL HCL 50 MG PO TABS: 50.0000 mg | ORAL_TABLET | Freq: Four times a day (QID) | ORAL | 0 refills | Status: AC | PRN

## 2019-07-20 MED ORDER — ONDANSETRON HCL 4 MG/2ML IJ SOLN
INTRAMUSCULAR | Status: DC | PRN
  Administered 2019-07-20: 4 mg via INTRAVENOUS

## 2019-07-20 MED ORDER — ONDANSETRON HCL 4 MG/2ML IJ SOLN: 4.0000 mg | Freq: Once | INTRAMUSCULAR | Status: DC | PRN

## 2019-07-20 MED ORDER — PROPOFOL 10 MG/ML IV BOLUS
INTRAVENOUS | Status: DC | PRN
  Administered 2019-07-20: 150 mg via INTRAVENOUS

## 2019-07-20 MED ORDER — LACTATED RINGERS IV SOLN
INTRAVENOUS | Status: DC
  Administered 2019-07-20: 13:00:00 via INTRAVENOUS

## 2019-07-20 MED ORDER — LACTATED RINGERS IV SOLN: INTRAVENOUS | Status: DC

## 2019-07-20 MED ORDER — DEXAMETHASONE SODIUM PHOSPHATE 10 MG/ML IJ SOLN
INTRAMUSCULAR | Status: DC | PRN
  Administered 2019-07-20: 10 mg via INTRAVENOUS

## 2019-07-20 MED ORDER — DEXAMETHASONE SODIUM PHOSPHATE 10 MG/ML IJ SOLN
INTRAMUSCULAR | Status: AC
  Filled 2019-07-20: qty 1

## 2019-07-20 SURGICAL SUPPLY — 23 items
BAG COUNTER SPONGE EZ (MISCELLANEOUS) ×2 IMPLANT
CATH ROBINSON RED A/P 16FR (CATHETERS) IMPLANT
FILTER UTR ASPR SPEC (MISCELLANEOUS) ×1 IMPLANT
FLTR UTR ASPR SPEC (MISCELLANEOUS) ×2
GLOVE BIOGEL PI IND STRL 6.5 (GLOVE) ×2 IMPLANT
GLOVE BIOGEL PI INDICATOR 6.5 (GLOVE) ×2
GLOVE SURG SYN 6.5 ES PF (GLOVE) ×4 IMPLANT
GOWN STRL REUS W/ TWL LRG LVL3 (GOWN DISPOSABLE) ×2 IMPLANT
GOWN STRL REUS W/TWL LRG LVL3 (GOWN DISPOSABLE) ×2
KIT BERKELEY 1ST TRIMESTER 3/8 (MISCELLANEOUS) ×2 IMPLANT
KIT TURNOVER CYSTO (KITS) ×2 IMPLANT
NS IRRIG 500ML POUR BTL (IV SOLUTION) ×2 IMPLANT
PACK DNC HYST (MISCELLANEOUS) ×2 IMPLANT
PAD OB MATERNITY 4.3X12.25 (PERSONAL CARE ITEMS) ×2 IMPLANT
PAD PREP 24X41 OB/GYN DISP (PERSONAL CARE ITEMS) ×2 IMPLANT
SET BERKELEY SUCTION TUBING (SUCTIONS) ×2 IMPLANT
TOWEL OR 17X26 4PK STRL BLUE (TOWEL DISPOSABLE) ×2 IMPLANT
VACURETTE 10 RIGID CVD (CANNULA) IMPLANT
VACURETTE 12 RIGID CVD (CANNULA) IMPLANT
VACURETTE 7MM F TIP (CANNULA) ×1
VACURETTE 7MM F TIP STRL (CANNULA) ×1 IMPLANT
VACURETTE 8 RIGID CVD (CANNULA) ×2 IMPLANT
VACURETTE 8MM F TIP (MISCELLANEOUS) ×2 IMPLANT

## 2019-07-20 NOTE — Interval H&P Note (Signed)
History and Physical Interval Note:  07/20/2019 1:37 PM  Mia Williams  has presented today for surgery, with the diagnosis of RETAINED PRODUCTS OF CONCEPTION.  The various methods of treatment have been discussed with the patient and family. After consideration of risks, benefits and other options for treatment, the patient has consented to  Procedure(s): DILATATION AND EVACUATION (N/A) as a surgical intervention.  The patient's history has been reviewed, patient examined, no change in status, stable for surgery.  I have reviewed the patient's chart and labs.  Questions were answered to the patient's satisfaction.     [name]

## 2019-07-20 NOTE — Transfer of Care (Signed)
Immediate Anesthesia Transfer of Care Note  Patient: Mia Williams  Procedure(s) Performed: DILATATION AND EVACUATION (N/A Uterus)  Patient Location: PACU  Anesthesia Type:General  Level of Consciousness: drowsy  Airway & Oxygen Therapy: Patient Spontanous Breathing and Patient connected to face mask oxygen  Post-op Assessment: Report given to RN and Post -op Vital signs reviewed and stable  Post vital signs: Reviewed and stable  Last Vitals:  Vitals Value Taken Time  BP 116/77 07/20/19 1426  Temp 36.1 C 07/20/19 1425  Pulse 94 07/20/19 1430  Resp 22 07/20/19 1430  SpO2 100 % 07/20/19 1430  Vitals shown include unvalidated device data.  Last Pain:  Vitals:   07/20/19 1225  TempSrc: Tympanic  PainSc: 0-No pain         Complications: No apparent anesthesia complications

## 2019-07-20 NOTE — Anesthesia Postprocedure Evaluation (Signed)
Anesthesia Post Note  Patient: Mia Williams  Procedure(s) Performed: DILATATION AND EVACUATION (N/A Uterus)  Patient location during evaluation: PACU Anesthesia Type: General Level of consciousness: awake and alert Pain management: pain level controlled Vital Signs Assessment: post-procedure vital signs reviewed and stable Respiratory status: spontaneous breathing, nonlabored ventilation, respiratory function stable and patient connected to nasal cannula oxygen Cardiovascular status: blood pressure returned to baseline and stable Postop Assessment: no apparent nausea or vomiting Anesthetic complications: no     Last Vitals:  Vitals:   07/20/19 1534 07/20/19 1618  BP: 130/82 132/83  Pulse: 65 77  Resp: 18 18  Temp:    SpO2: 100% 100%    Last Pain:  Vitals:   07/20/19 1618  TempSrc:   PainSc: [place]

## 2019-07-20 NOTE — Anesthesia Post-op Follow-up Note (Signed)
Anesthesia QCDR form completed.        

## 2019-07-20 NOTE — Anesthesia Procedure Notes (Signed)
Procedure Name: LMA Insertion Date/Time: 07/20/2019 1:46 PM Performed by: Johnna Acosta, CRNA Pre-anesthesia Checklist: Patient identified, Emergency Drugs available, Suction available, Patient being monitored and Timeout performed Patient Re-evaluated:Patient Re-evaluated prior to induction Oxygen Delivery Method: Circle system utilized Preoxygenation: Pre-oxygenation with 100% oxygen Induction Type: IV induction LMA: LMA inserted LMA Size: 3.5 Tube type: Oral Number of attempts: 1 Placement Confirmation: positive ETCO2 and breath sounds checked- equal and bilateral Dental Injury: Teeth and Oropharynx as per pre-operative assessment

## 2019-07-20 NOTE — Discharge Instructions (Signed)
Dilation and Curettage, Care After These instructions give you information about caring for yourself after your procedure. Your doctor may also give you more specific instructions. Call your doctor if you have any problems or questions after your procedure. Follow these instructions at home: Activity  Do not drive or use heavy machinery while taking prescription pain medicine.  For 24 hours after your procedure, avoid driving.  Take short walks often, followed by rest periods. Ask your doctor what activities are safe for you. After one or two days, you may be able to return to your normal activities.  Do not lift anything that is heavier than 10 lb (4.5 kg) until your doctor approves.  For at least 2 weeks, or as long as told by your doctor: ? Do not douche. ? Do not use tampons. ? Do not have sex. General instructions  Take over-the-counter and prescription medicines only as told by your doctor. This is very important if you take blood thinning medicine.  Do not take baths, swim, or use a hot tub until your doctor approves. Take showers instead of baths.  It is up to you to get the results of your procedure. Ask your doctor when your results will be ready.  Keep all follow-up visits as told by your doctor. This is important. Contact a doctor if:  You have very bad cramps that get worse or do not get better with medicine.  You have very bad pain in your belly (abdomen).  You cannot drink fluids without throwing up (vomiting).  You get pain in a different part of the area between your belly and thighs (pelvis).  You have bad-smelling discharge from your vagina.  You have a rash. Get help right away if:  You are bleeding a lot from your vagina. A lot of bleeding means soaking more than one sanitary pad in an hour, for 2 hours in a row.  You have clumps of blood (blood clots) coming from your vagina.  You have a fever or chills.  Your belly feels very tender or  hard.  You have chest pain.  You have trouble breathing.  You cough up blood.  You feel dizzy.  You feel light-headed.  You pass out (faint).  You have pain in your neck or shoulder area. Summary  Take short walks often, followed by rest periods. Ask your doctor what activities are safe for you. After one or two days, you may be able to return to your normal activities.  Do not lift anything that is heavier than 10 lb (4.5 kg) until your doctor approves.  Do not take baths, swim, or use a hot tub until your doctor approves. Take showers instead of baths.  Contact your doctor if you have any symptoms of infection, like bad-smelling discharge from your vagina. This information is not intended to replace advice given to you by your health care provider. Make sure you discuss any questions you have with your health care provider. Document Released: 09/01/2008 Document Revised: 11/05/2017 Document Reviewed: 08/10/2016 Elsevier Patient Education  2020 [employer].

## 2019-07-20 NOTE — Op Note (Signed)
  Operative Note  07/20/2019 2:28 PM  PRE-OP DIAGNOSIS: RETAINED PRODUCTS OF CONCEPTION   POST-OP DIAGNOSIS: same  SURGEON: Adrian Prows MD  ANESTHESIA: General   PROCEDURE: Procedure(s): DILATATION AND EVACUATION   ESTIMATED BLOOD LOSS: 50 cc   SPECIMENS: POC   COMPLICATIONS: none  DISPOSITION: PACU - hemodynamically stable.  CONDITION: stable  FINDINGS: Exam under anesthesia revealed a 10 wk size uterus without palpable adnexal masses.   INDICATION FOR PROCEDURE: Retained products of conception, failed medical management  PROCEDURE IN DETAIL: After informed consent was obtained, the patient was taken to the operating room where anesthesia was obtained without difficulty. The patient was positioned in the dorsal lithotomy position with [employer]. Time out was performed and an exam under anesthesia was performed. The vagina, perineum, and lower abdomen were prepped and draped in a normal sterile fashion. The bladder was emptied with an I&O catheter. A speculum was placed into the vagina and the cervix was grasped with a single toothed tenaculum.  The cervix was gently dilated to 50 [place] with  [employer] dilators. The suction was then tested and found to be adequate, and a 7 mm flexible suction cannula was advanced into the uterine cavity. The suction was activated and the contents of the uterus were aspirated until no further tissue was obtained. The uterus was then curetted to gritty texture throughout.  At the end of the procedure bleeding was noted to be minimal.  All instruments were then removed from the vagina.The patient tolerated the procedure well. All sponge, instrument, and needle counts were correct. The patient was taken to the recovery room in good condition.   Adrian Prows MD [facility] 07/20/2019 2:28 PM

## 2019-07-20 NOTE — Anesthesia Preprocedure Evaluation (Signed)
Anesthesia Evaluation  Patient identified by MRN, date of birth, ID band Patient awake    Reviewed: Allergy & Precautions, NPO status , Patient's Chart, lab work & pertinent test results, reviewed documented beta blocker date and time   Airway Mallampati: II  TM Distance: >3 FB     Dental  (+) Chipped   Pulmonary           Cardiovascular      Neuro/Psych    GI/Hepatic   Endo/Other    Renal/GU      Musculoskeletal   Abdominal   Peds  Hematology   Anesthesia Other Findings   Reproductive/Obstetrics                             Anesthesia Physical Anesthesia Plan  ASA: II  Anesthesia Plan: General   Post-op Pain Management:    Induction: Intravenous  PONV Risk Score and Plan:   Airway Management Planned: LMA  Additional Equipment:   Intra-op Plan:   Post-operative Plan:   Informed Consent: I have reviewed the patients History and Physical, chart, labs and discussed the procedure including the risks, benefits and alternatives for the proposed anesthesia with the patient or authorized representative who has indicated his/her understanding and acceptance.       Plan Discussed with: CRNA  Anesthesia Plan Comments:         Anesthesia Quick Evaluation  

## 2019-07-24 LAB — SURGICAL PATHOLOGY

## 2019-07-31 ENCOUNTER — Ambulatory Visit (INDEPENDENT_AMBULATORY_CARE_PROVIDER_SITE_OTHER): Payer: BC Managed Care – PPO | Admitting: Obstetrics and Gynecology

## 2019-07-31 ENCOUNTER — Other Ambulatory Visit: Payer: Self-pay

## 2019-07-31 ENCOUNTER — Encounter: Payer: Self-pay | Admitting: Obstetrics and Gynecology

## 2019-07-31 VITALS — BP 124/70 | Ht 63.0 in | Wt 167.0 lb

## 2019-07-31 DIAGNOSIS — Z9889 Other specified postprocedural states: Secondary | ICD-10-CM

## 2019-07-31 NOTE — Progress Notes (Signed)
  Postoperative Follow-up Patient presents post op from Dilation and extraction for retained products of conception, 1 week ago.  Subjective: Patient reports marked improvement in her preop symptoms. Eating a regular diet without difficulty. Pain is controlled without any medications.  Activity: normal activities of daily living. Patient reports additional symptom's since surgery of None.  Objective: BP 124/70   Ht 5\' 3"  (1.6 m)   Wt 167 lb (75.8 kg)   LMP 04/21/2019   Breastfeeding Unknown   BMI 29.58 kg/m  Physical Exam Constitutional:      Appearance: She is well-developed.  Genitourinary:     Vagina and uterus normal.     No lesions in the vagina.     No cervical motion tenderness.     No right or left adnexal mass present.  HENT:     Head: Normocephalic and atraumatic.  Neck:     Musculoskeletal: Neck supple.     Thyroid: No thyromegaly.  Cardiovascular:     Rate and Rhythm: Normal rate and regular rhythm.     Heart sounds: Normal heart sounds.  Pulmonary:     Effort: Pulmonary effort is normal.     Breath sounds: Normal breath sounds.  Chest:     Breasts:        Right: No inverted nipple, mass, nipple discharge or skin change.        Left: No inverted nipple, mass, nipple discharge or skin change.  Abdominal:     General: Bowel sounds are normal. There is no distension.     Palpations: Abdomen is soft. There is no mass.  Neurological:     Mental Status: She is alert and oriented to person, place, and time.  Skin:    General: Skin is warm and dry.  Psychiatric:        Behavior: Behavior normal.        Thought Content: Thought content normal.        Judgment: Judgment normal.  Vitals signs reviewed.     Assessment: s/p :  Dilation and extraction stable  Plan: Patient has done well after surgery with no apparent complications.  I have discussed the post-operative course to date, and the expected progress moving forward.  The patient understands what  complications to be concerned about.  I will see the patient in routine follow up, or sooner if needed.    Declines contraception at this time, would like to conceive in the near future  Activity plan: No restriction. Pelvic rest.  Folashade Gamboa R Ada Woodbury 07/31/2019, 3:00 PM

## 2019-10-24 ENCOUNTER — Other Ambulatory Visit: Payer: Self-pay

## 2019-10-24 DIAGNOSIS — Z20822 Contact with and (suspected) exposure to covid-19: Secondary | ICD-10-CM

## 2019-10-26 LAB — NOVEL CORONAVIRUS, NAA: SARS-CoV-2, NAA: NOT DETECTED

## 2020-07-28 IMAGING — US OBSTETRIC <14 WK US AND TRANSVAGINAL OB US
1 series · 14 of 28 positions shown · non-contrast
Comparison: 06/29/2019.

CLINICAL DATA: This carries 06/07/2019. Increased bleeding and
cramping.

EXAM:
OBSTETRIC <14 WK US AND TRANSVAGINAL OB US
TECHNIQUE: Both transabdominal and transvaginal ultrasound examinations were
performed for complete evaluation of the gestation as well as the
maternal uterus, adnexal regions, and pelvic cul-de-sac.
Transvaginal technique was performed to assess early pregnancy.

[Series 1: obstetric <14 wk us and transvaginal ob us · 14 of 44 slices shown]
[im 2/44]
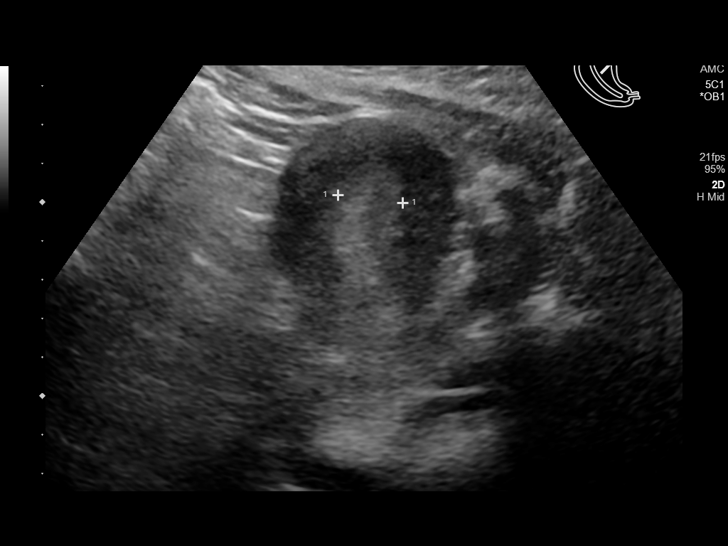
[im 5/44]
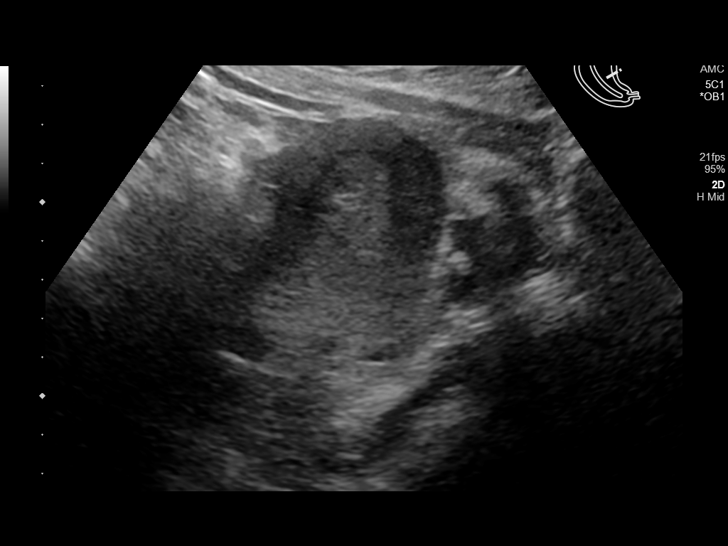
[im 8/44]
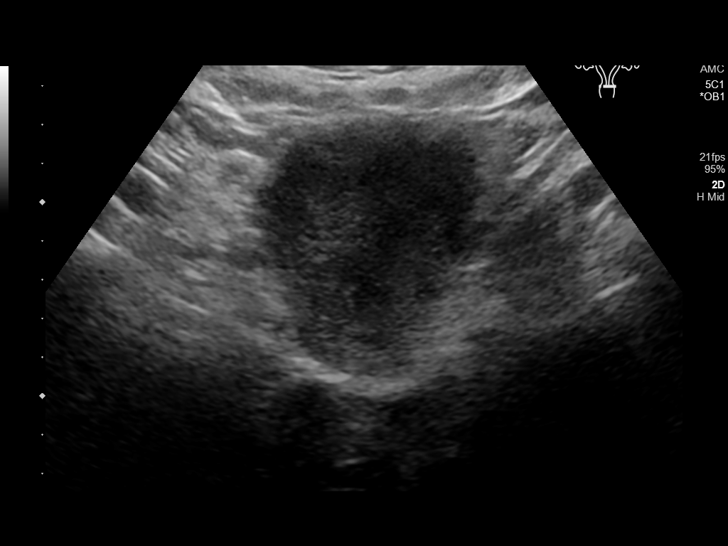
[im 12/44]
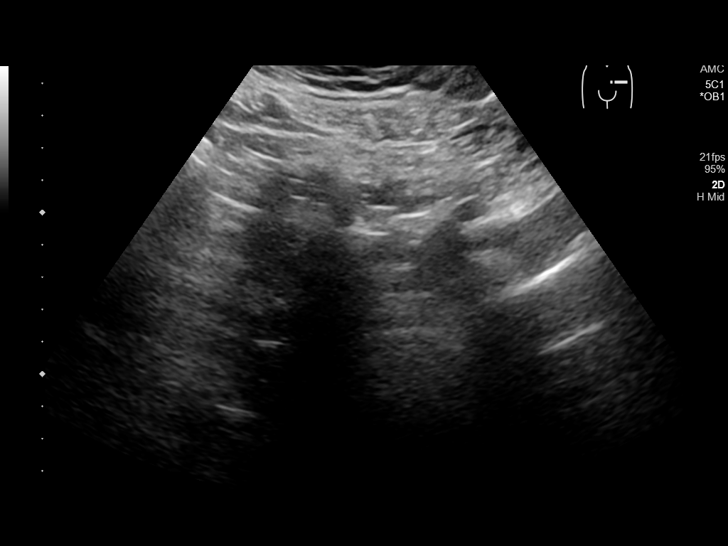
[im 15/44]
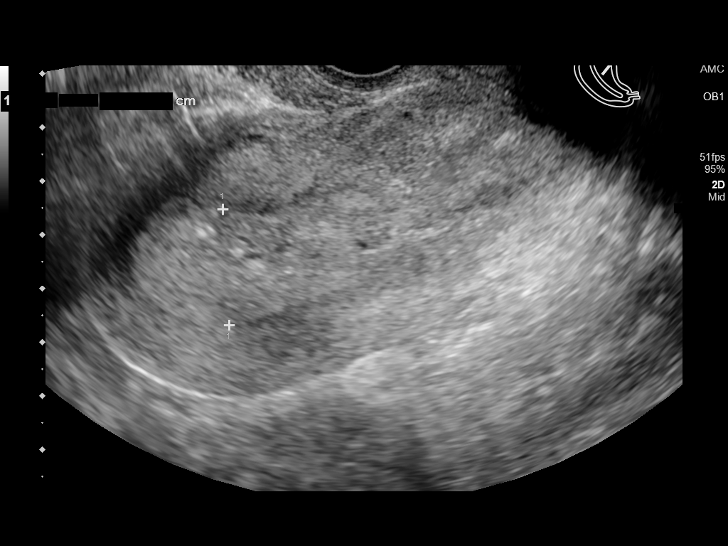
[im 18/44]
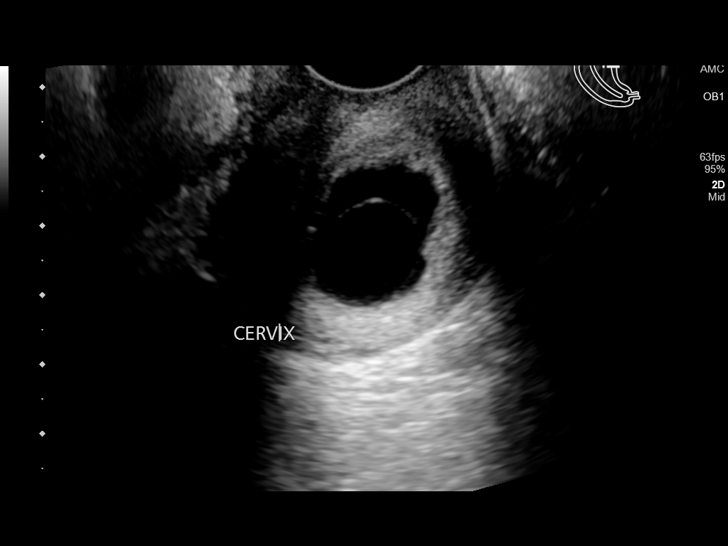
[im 21/44]
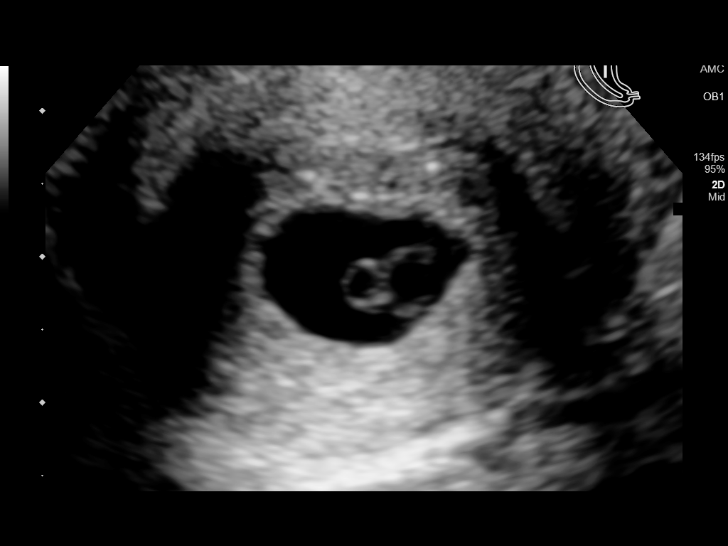
[im 24/44]
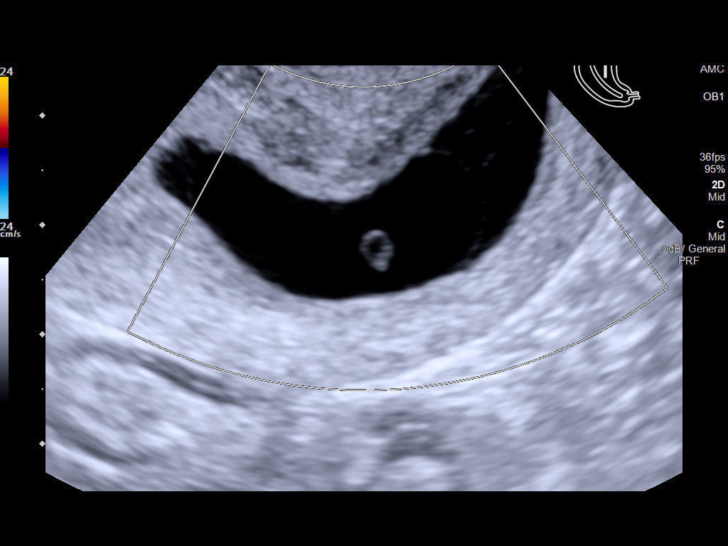
[im 28/44]
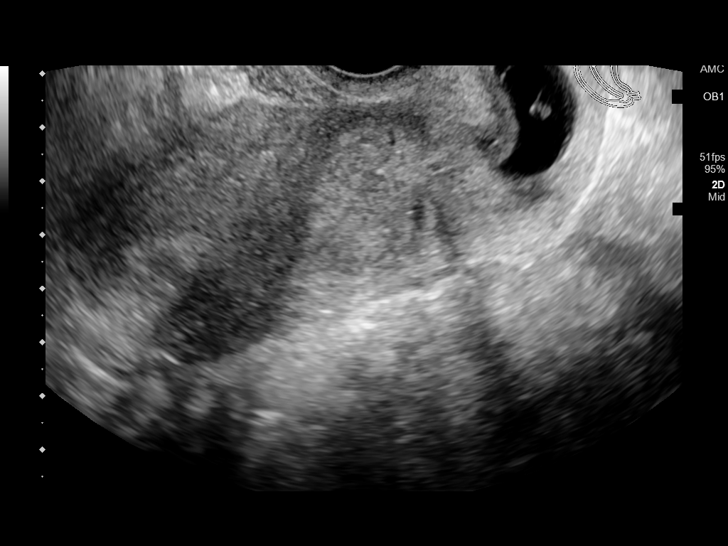
[im 31/44]
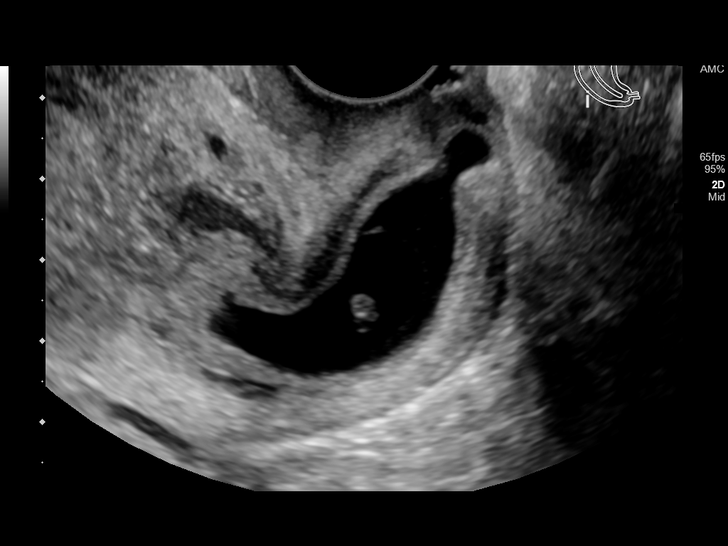
[im 34/44]
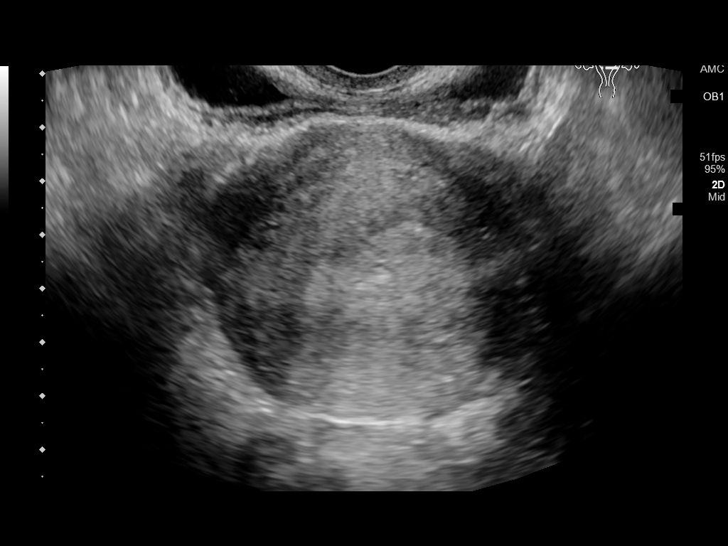
[im 37/44]
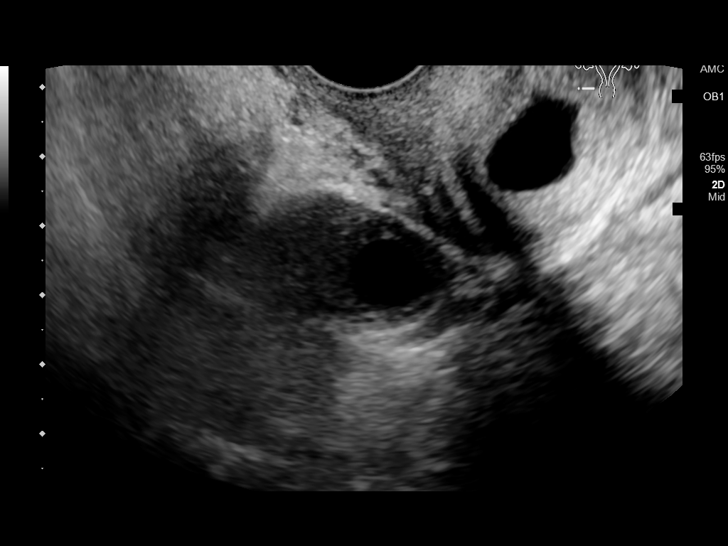
[im 40/44]
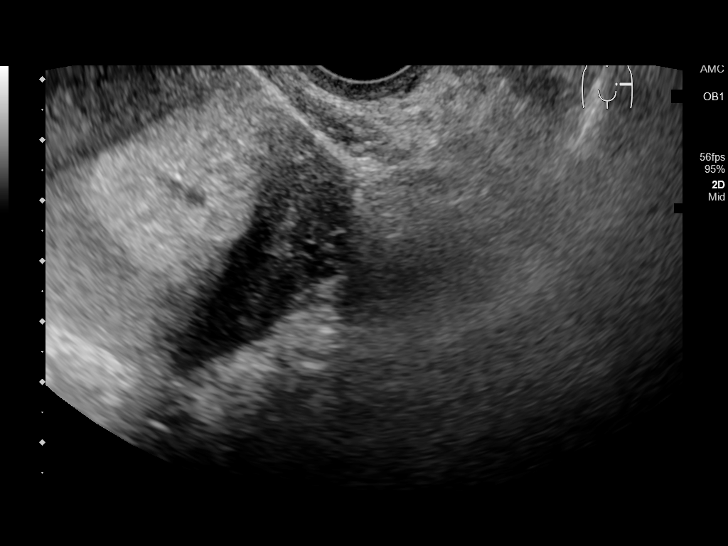
[im 44/44]
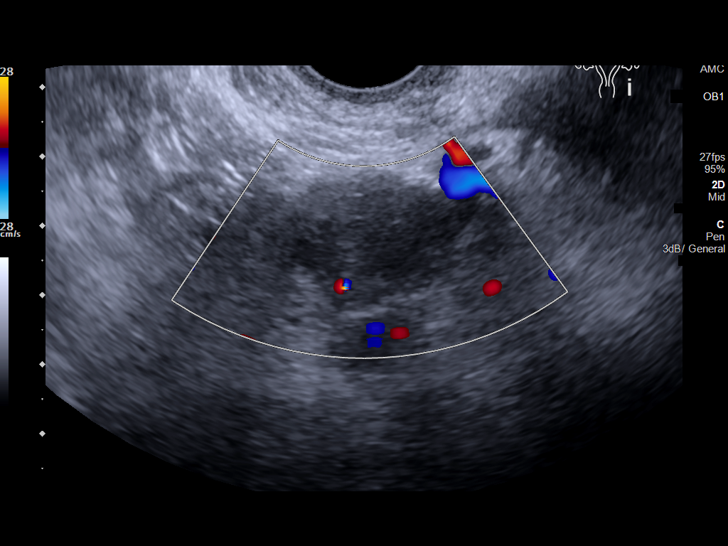

[14 of 28 positions shown; findings below may reference images not displayed]

FINDINGS: Intrauterine gestational sac: Gestational sac noted the cervical
canal.

Yolk sac: Yolk sac noted within the gestational sac in the cervical
canal.

Embryo:  None visualized

Cardiac Activity: None visualized

Subchorionic hemorrhage:  None visualized.

Maternal uterus/adnexae: No free pelvic fluid.
IMPRESSION: Gestational sac noted within the cervical canal suggesting aborting
pregnancy.

## 2020-12-13 ENCOUNTER — Other Ambulatory Visit (HOSPITAL_COMMUNITY)
Admission: RE | Admit: 2020-12-13 | Discharge: 2020-12-13 | Disposition: A | Discharge status: Home / Self Care | Payer: BC Managed Care – PPO | Source: Ambulatory Visit | Attending: Obstetrics and Gynecology | Admitting: Obstetrics and Gynecology

## 2020-12-13 ENCOUNTER — Ambulatory Visit (INDEPENDENT_AMBULATORY_CARE_PROVIDER_SITE_OTHER): Payer: BC Managed Care – PPO | Admitting: Obstetrics and Gynecology

## 2020-12-13 ENCOUNTER — Other Ambulatory Visit: Payer: Self-pay

## 2020-12-13 ENCOUNTER — Encounter: Payer: Self-pay | Admitting: Obstetrics and Gynecology

## 2020-12-13 VITALS — BP 134/72 | Ht 63.0 in | Wt 168.6 lb

## 2020-12-13 DIAGNOSIS — F411 Generalized anxiety disorder: Secondary | ICD-10-CM

## 2020-12-13 DIAGNOSIS — N926 Irregular menstruation, unspecified: Secondary | ICD-10-CM | POA: Diagnosis not present

## 2020-12-13 DIAGNOSIS — Z3481 Encounter for supervision of other normal pregnancy, first trimester: Secondary | ICD-10-CM | POA: Insufficient documentation

## 2020-12-13 DIAGNOSIS — O09892 Supervision of other high risk pregnancies, second trimester: Secondary | ICD-10-CM

## 2020-12-13 DIAGNOSIS — Z3A25 25 weeks gestation of pregnancy: Secondary | ICD-10-CM | POA: Diagnosis not present

## 2020-12-13 DIAGNOSIS — O0992 Supervision of high risk pregnancy, unspecified, second trimester: Secondary | ICD-10-CM | POA: Diagnosis not present

## 2020-12-13 DIAGNOSIS — Z3490 Encounter for supervision of normal pregnancy, unspecified, unspecified trimester: Secondary | ICD-10-CM | POA: Insufficient documentation

## 2020-12-13 HISTORY — DX: Supervision of other high risk pregnancies, second trimester: O09.892

## 2020-12-13 LAB — POCT URINALYSIS DIPSTICK OB
Glucose, UA: NEGATIVE
POC,PROTEIN,UA: NEGATIVE

## 2020-12-13 LAB — POCT URINE PREGNANCY: Preg Test, Ur: POSITIVE — AB

## 2020-12-13 MED ORDER — SERTRALINE HCL 50 MG PO TABS: 50.0000 mg | ORAL_TABLET | Freq: Every day | ORAL | 3 refills | Status: DC

## 2020-12-13 NOTE — Patient Instructions (Addendum)
First Trimester of Pregnancy The first trimester of pregnancy is from week 1 until the end of week 13 (months 1 through 3). A week after a sperm fertilizes an egg, the egg will implant on the wall of the uterus. This embryo will begin to develop into a baby. Genes from you and your partner will form the baby. The female genes will determine whether the baby will be a boy or a girl. At 6-8 weeks, the eyes and face will be formed, and the heartbeat can be seen on ultrasound. At the end of 12 weeks, all the baby's organs will be formed. Now that you are pregnant, you will want to do everything you can to have a healthy baby. Two of the most important things are to get good prenatal care and to follow your health care provider's instructions. Prenatal care is all the medical care you receive before the baby's birth. This care will help prevent, find, and treat any problems during the pregnancy and childbirth. Body changes during your first trimester Your body goes through many changes during pregnancy. The changes vary from woman to woman.  You may gain or lose a couple of pounds at first.  You may feel sick to your stomach (nauseous) and you may throw up (vomit). If the vomiting is uncontrollable, call your health care provider.  You may tire easily.  You may develop headaches that can be relieved by medicines. All medicines should be approved by your health care provider.  You may urinate more often. Painful urination may mean you have a bladder infection.  You may develop heartburn as a result of your pregnancy.  You may develop constipation because certain hormones are causing the muscles that push stool through your intestines to slow down.  You may develop hemorrhoids or swollen veins (varicose veins).  Your breasts may begin to grow larger and become tender. Your nipples may stick out more, and the tissue that surrounds them (areola) may become darker.  Your gums may bleed and may be  sensitive to brushing and flossing.  Dark spots or blotches (chloasma, mask of pregnancy) may develop on your face. This will likely fade after the baby is born.  Your menstrual periods will stop.  You may have a loss of appetite.  You may develop cravings for certain kinds of food.  You may have changes in your emotions from day to day, such as being excited to be pregnant or being concerned that something may go wrong with the pregnancy and baby.  You may have more vivid and strange dreams.  You may have changes in your hair. These can include thickening of your hair, rapid growth, and changes in texture. Some women also have hair loss during or after pregnancy, or hair that feels dry or thin. Your hair will most likely return to normal after your baby is born. What to expect at prenatal visits During a routine prenatal visit:  You will be weighed to make sure you and the baby are growing normally.  Your blood pressure will be taken.  Your abdomen will be measured to track your baby's growth.  The fetal heartbeat will be listened to between weeks 10 and 14 of your pregnancy.  Test results from any previous visits will be discussed. Your health care provider may ask you:  How you are feeling.  If you are feeling the baby move.  If you have had any abnormal symptoms, such as leaking fluid, bleeding, severe headaches, or abdominal   cramping.  If you are using any tobacco products, including cigarettes, chewing tobacco, and electronic cigarettes.  If you have any questions. Other tests that may be performed during your first trimester include:  Blood tests to find your blood type and to check for the presence of any previous infections. The tests will also be used to check for low iron levels (anemia) and protein on red blood cells (Rh antibodies). Depending on your risk factors, or if you previously had diabetes during pregnancy, you may have tests to check for high blood sugar  that affects pregnant women (gestational diabetes).  Urine tests to check for infections, diabetes, or protein in the urine.  An ultrasound to confirm the proper growth and development of the baby.  Fetal screens for spinal cord problems (spina bifida) and Down syndrome.  HIV (human immunodeficiency virus) testing. Routine prenatal testing includes screening for HIV, unless you choose not to have this test.  You may need other tests to make sure you and the baby are doing well. Follow these instructions at home: Medicines  Follow your health care provider's instructions regarding medicine use. Specific medicines may be either safe or unsafe to take during pregnancy.  Take a prenatal vitamin that contains at least 600 micrograms (mcg) of folic acid.  If you develop constipation, try taking a stool softener if your health care provider approves. Eating and drinking   Eat a balanced diet that includes fresh fruits and vegetables, whole grains, good sources of protein such as meat, eggs, or tofu, and low-fat dairy. Your health care provider will help you determine the amount of weight gain that is right for you.  Avoid raw meat and uncooked cheese. These carry germs that can cause birth defects in the baby.  Eating four or five small meals rather than three large meals a day may help relieve nausea and vomiting. If you start to feel nauseous, eating a few soda crackers can be helpful. Drinking liquids between meals, instead of during meals, also seems to help ease nausea and vomiting.  Limit foods that are high in fat and processed sugars, such as fried and sweet foods.  To prevent constipation: ? Eat foods that are high in fiber, such as fresh fruits and vegetables, whole grains, and beans. ? Drink enough fluid to keep your urine clear or pale yellow. Activity  Exercise only as directed by your health care provider. Most women can continue their usual exercise routine during  pregnancy. Try to exercise for 30 minutes at least 5 days a week. Exercising will help you: ? Control your weight. ? Stay in shape. ? Be prepared for labor and delivery.  Experiencing pain or cramping in the lower abdomen or lower back is a good sign that you should stop exercising. Check with your health care provider before continuing with normal exercises.  Try to avoid standing for long periods of time. Move your legs often if you must stand in one place for a long time.  Avoid heavy lifting.  Wear low-heeled shoes and practice good posture.  You may continue to have sex unless your health care provider tells you not to. Relieving pain and discomfort  Wear a good support bra to relieve breast tenderness.  Take warm sitz baths to soothe any pain or discomfort caused by hemorrhoids. Use hemorrhoid cream if your health care provider approves.  Rest with your legs elevated if you have leg cramps or low back pain.  If you develop varicose veins in   your legs, wear support hose. Elevate your feet for 15 minutes, 3-4 times a day. Limit salt in your diet. Prenatal care  Schedule your prenatal visits by the twelfth week of pregnancy. They are usually scheduled monthly at first, then more often in the last 2 months before delivery.  Write down your questions. Take them to your prenatal visits.  Keep all your prenatal visits as told by your health care provider. This is important. Safety  Wear your seat belt at all times when driving.  Make a list of emergency phone numbers, including numbers for family, friends, the hospital, and police and fire departments. General instructions  Ask your health care provider for a referral to a local prenatal education class. Begin classes no later than the beginning of month 6 of your pregnancy.  Ask for help if you have counseling or nutritional needs during pregnancy. Your health care provider can offer advice or refer you to specialists for help  with various needs.  Do not use hot tubs, steam rooms, or saunas.  Do not douche or use tampons or scented sanitary pads.  Do not cross your legs for long periods of time.  Avoid cat litter boxes and soil used by cats. These carry germs that can cause birth defects in the baby and possibly loss of the fetus by miscarriage or stillbirth.  Avoid all smoking, herbs, alcohol, and medicines not prescribed by your health care provider. Chemicals in these products affect the formation and growth of the baby.  Do not use any products that contain nicotine or tobacco, such as cigarettes and e-cigarettes. If you need help quitting, ask your health care provider. You may receive counseling support and other resources to help you quit.  Schedule a dentist appointment. At home, brush your teeth with a soft toothbrush and be gentle when you floss. Contact a health care provider if:  You have dizziness.  You have mild pelvic cramps, pelvic pressure, or nagging pain in the abdominal area.  You have persistent nausea, vomiting, or diarrhea.  You have a bad smelling vaginal discharge.  You have pain when you urinate.  You notice increased swelling in your face, hands, legs, or ankles.  You are exposed to fifth disease or chickenpox.  You are exposed to [place] measles (rubella) and have never had it. Get help right away if:  You have a fever.  You are leaking fluid from your vagina.  You have spotting or bleeding from your vagina.  You have severe abdominal cramping or pain.  You have rapid weight gain or loss.  You vomit blood or material that looks like coffee grounds.  You develop a severe headache.  You have shortness of breath.  You have any kind of trauma, such as from a fall or a car accident. Summary  The first trimester of pregnancy is from week 1 until the end of week 13 (months 1 through 3).  Your body goes through many changes during pregnancy. The changes vary from  woman to woman.  You will have routine prenatal visits. During those visits, your health care provider will examine you, discuss any test results you may have, and talk with you about how you are feeling. This information is not intended to replace advice given to you by your health care provider. Make sure you discuss any questions you have with your health care provider. Document Revised: 11/05/2017 Document Reviewed: 11/04/2016 Elsevier Patient Education  [place], Adult After  being diagnosed with an anxiety disorder, you may be relieved to know why you have felt or behaved a certain way. You may also feel overwhelmed about the treatment ahead and what it will mean for your life. With care and support, you can manage this condition and recover from it. How to manage lifestyle changes Managing stress and anxiety  Stress is your body's reaction to life changes and events, both good and bad. Most stress will last just a few hours, but stress can be ongoing and can lead to more than just stress. Although stress can play a major role in anxiety, it is not the same as anxiety. Stress is usually caused by something external, such as a deadline, test, or competition. Stress normally passes after the triggering event has ended.  Anxiety is caused by something internal, such as imagining a terrible outcome or worrying that something will go wrong that will devastate you. Anxiety often does not go away even after the triggering event is over, and it can become long-term (chronic) worry. It is important to understand the differences between stress and anxiety and to manage your stress effectively so that it does not lead to an anxious response. Talk with your health care provider or a counselor to learn more about reducing anxiety and stress. He or she may suggest tension reduction techniques, such as:  Music therapy. This can include creating or listening to music that you  enjoy and that inspires you.  Mindfulness-based meditation. This involves being aware of your normal breaths while not trying to control your breathing. It can be done while sitting or walking.  Centering prayer. This involves focusing on a word, phrase, or sacred image that means something to you and brings you peace.  Deep breathing. To do this, expand your stomach and inhale slowly through your nose. Hold your breath for 3-5 seconds. Then exhale slowly, letting your stomach muscles relax.  Self-talk. This involves identifying thought patterns that lead to anxiety reactions and changing those patterns.  Muscle relaxation. This involves tensing muscles and then relaxing them. Choose a tension reduction technique that suits your lifestyle and personality. These techniques take time and practice. Set aside 5-15 minutes a day to do them. Therapists can offer counseling and training in these techniques. The training to help with anxiety may be covered by some insurance plans. Other things you can do to manage stress and anxiety include:  Keeping a stress/anxiety diary. This can help you learn what triggers your reaction and then learn ways to manage your response.  Thinking about how you react to certain situations. You may not be able to control everything, but you can control your response.  Making time for activities that help you relax and not feeling guilty about spending your time in this way.  Visual imagery and yoga can help you stay calm and relax.  Medicines Medicines can help ease symptoms. Medicines for anxiety include:  Anti-anxiety drugs.  Antidepressants. Medicines are often used as a primary treatment for anxiety disorder. Medicines will be prescribed by a health care provider. When used together, medicines, psychotherapy, and tension reduction techniques may be the most effective treatment. Relationships Relationships can play a big part in helping you recover. Try to spend  more time connecting with trusted friends and family members. Consider going to couples counseling, taking family education classes, or going to family therapy. Therapy can help you and others better understand your condition. How to recognize changes in your anxiety Everyone responds  differently to treatment for anxiety. Recovery from anxiety happens when symptoms decrease and stop interfering with your daily activities at home or work. This may mean that you will start to:  Have better concentration and focus. Worry will interfere less in your daily thinking.  Sleep better.  Be less irritable.  Have more energy.  Have improved memory. It is important to recognize when your condition is getting worse. Contact your health care provider if your symptoms interfere with home or work and you feel like your condition is not improving. Follow these instructions at home: Activity  Exercise. Most adults should do the following: ? Exercise for at least 150 minutes each week. The exercise should increase your heart rate and make you sweat (moderate-intensity exercise). ? Strengthening exercises at least twice a week.  Get the right amount and quality of sleep. Most adults need 7-9 hours of sleep each night. Lifestyle   Eat a healthy diet that includes plenty of vegetables, fruits, whole grains, low-fat dairy products, and lean protein. Do not eat a lot of foods that are high in solid fats, added sugars, or salt.  Make choices that simplify your life.  Do not use any products that contain nicotine or tobacco, such as cigarettes, e-cigarettes, and chewing tobacco. If you need help quitting, ask your health care provider.  Avoid caffeine, alcohol, and certain over-the-counter cold medicines. These may make you feel worse. Ask your pharmacist which medicines to avoid. General instructions  Take over-the-counter and prescription medicines only as told by your health care provider.  Keep all  follow-up visits as told by your health care provider. This is important. Where to find support You can get help and support from these sources:  Self-help groups.  Online and [employer].  A trusted spiritual leader.  Couples counseling.  Family education classes.  Family therapy. Where to find more information You may find that joining a support group helps you deal with your anxiety. The following sources can help you locate counselors or support groups near you:  Mental Health America: www.mentalhealthamerica.net  Anxiety and Depression Association of [place] (ADAA): ProgramCam.de  [employer] on Mental Illness (NAMI): www.nami.org Contact a health care provider if you:  Have a hard time staying focused or finishing daily tasks.  Spend many hours a day feeling worried about everyday life.  Become exhausted by worry.  Start to have headaches, feel tense, or have nausea.  Urinate more than normal.  Have diarrhea. Get help right away if you have:  A racing heart and shortness of breath.  Thoughts of hurting yourself or others. If you ever feel like you may hurt yourself or others, or have thoughts about taking your own life, get help right away. You can go to your nearest emergency department or call:  Your local emergency services (911 in the [place]  A suicide crisis helpline, such as the National Suicide Prevention Lifeline at 562-309-4603. This is open 24 hours a day. Summary  Taking steps to learn and use tension reduction techniques can help calm you and help prevent triggering an anxiety reaction.  When used together, medicines, psychotherapy, and tension reduction techniques may be the most effective treatment.  Family, friends, and partners can play a big part in helping you recover from an anxiety disorder. This information is not intended to replace advice given to you by your health care provider. Make sure you discuss any questions  you have with your health care provider. Document Revised: 04/25/2019 Document Reviewed:  04/25/2019 Elsevier Patient Education  2020 [employer].    [facility], [place] Same Day Access Hours:  Monday, Wednesday and Friday, 8am - 3pm Walk-In Crisis Hours: 7 days/wk, 8am - 8pm [address], [place], [place] [address] [phone]  [employer] [phone]  Mobile Crisis Unit [phone]   Therapists/Counselors/Psychologists  [facility] [address] [facility] [address] [place], [place] [address] [phone]  Karen [place], [place]  & Jacqlyn Krauss [place]    [phone]        [address]       [place], [place] [address]        Ival Bible, CSW [phone] [address] [place], [place] [address]  Harle Battiest, [place]        [phone]        [address]      [place], [place] [address]        Chyrel Masson, [place] [phone] [address] [place], [place] [address]   Oscar La, LMFT       [phone]        [address]       [place], [place] [address]        Felecia Jan [phone] [address] [place], [place] [address]  Lester Malta        [phone]        [address]       [place], [place] [address]        Kerin Salen [phone] [address] [place], [place] [address]  Tyron Russell, PsyD       [phone]        [address]       [place], [place] [address]        Elita Quick, LPC [phone] [address]  [place], [place] [address]   Debarah Crape        [phone]        [address] [place], [place] [address]         [facility] [phone] [email or web address]   Sation Konchella       [phone]        [address]       [place], [place] [address]        Morton Stall [facility] [phone] [email or web address]

## 2020-12-13 NOTE — Progress Notes (Signed)
12/13/2020   Chief Complaint: Missed period  Transfer of Care Patient: no  History of Present Illness: Mia Williams is a 35 y.o. G2P0 [redacted]w[redacted]d based on Patient's last menstrual period was 10/17/2020. with an Estimated Date of Delivery: 07/24/21, with the above CC.   Her periods were: montlhy She was using no method when she conceived.  She has Negative signs or symptoms of nausea/vomiting of pregnancy. She has Negative signs or symptoms of miscarriage or preterm labor She was not taking different medications around the time she conceived/early pregnancy. Since her LMP, she has not used alcohol Since her LMP, she has not used tobacco products Since her LMP, she has not used illegal drugs.    Current or past history of domestic violence. no  Infection History:  1. Since her LMP, she has not had a viral illness.  2. 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 She has a history of chicken pox. She reports vaccination for chicken pox in the past. 4. Patient or partner has history of genital herpes  no 5. History of STI (GC, CT, HPV, syphilis, HIV)  no   6.  She does not live with someone with TB or TB exposed. 7. History of recent travel :  no 8. She identifies Negative Zika risk factors for her and her partner 63. There are not cats in the home in the home.  She understands that while pregnant she should not change cat litter.   Genetic Screening Questions: (Includes patient, baby's father, or anyone in either family)   1. Patient's age >/= 91 at [facility]  no 2. Thalassemia ([place], [place], Mediterranean, or Asian background): MCV<80  no 3. Neural tube defect (meningomyelocele, spina bifida, anencephaly)  no 4. Congenital heart defect  no  5. Down syndrome  no 6. Tay-Sachs (Jewish, [place]  no 7. Canavan's Disease  no 8. Sickle cell disease or trait (African)  no  9. Hemophilia or other blood disorders  no  10. Muscular dystrophy  no  11. Cystic  fibrosis  no  12. Huntington's Chorea  no  13. Mental retardation/autism  no 14. Other inherited genetic or chromosomal disorder  no 15. Maternal metabolic disorder (DM, PKU, etc)  no 16. Patient or FOB with a child with a birth defect not listed above no  16a. Patient or FOB with a birth defect themselves no 17. Recurrent pregnancy loss, or stillbirth  no  18. Any medications since LMP other than prenatal vitamins (include vitamins, supplements, OTC meds, drugs, alcohol)  no 19. Any other genetic/environmental exposure to discuss  no  ROS:  Review of Systems  Constitutional: Negative for chills, fever, malaise/fatigue and weight loss.  HENT: Negative for congestion, hearing loss and sinus pain.   Eyes: Negative for blurred vision and double vision.  Respiratory: Negative for cough, sputum production, shortness of breath and wheezing.   Cardiovascular: Negative for chest pain, palpitations, orthopnea and leg swelling.  Gastrointestinal: Negative for abdominal pain, constipation, diarrhea, nausea and vomiting.  Genitourinary: Negative for dysuria, flank pain, frequency, hematuria and urgency.  Musculoskeletal: Negative for back pain, falls and joint pain.  Skin: Negative for itching and rash.  Neurological: Negative for dizziness and headaches.  Psychiatric/Behavioral: Negative for depression, substance abuse and suicidal ideas. The patient is not nervous/anxious.     OBGYN History: As per HPI. OB History  Gravida Para Term Preterm AB Living  2  SAB IAB Ectopic Multiple Live Births               # Outcome Date GA Lbr Len/2nd Weight Sex Delivery Anes PTL Lv  2 Current           1 Gravida             Any issues with any prior pregnancies: no Any prior children are healthy, doing well, without any problems or issues: not applicable Last pap smear 2021 NIL History of STIs: No   Past Medical History: Past Medical History:  Diagnosis Date  . History of normal Holter  exam    11 YEARS AGO. TOLD HEART LAYS CLOSER TO STERNUM THAN USUAL SO FEELS HEART BEAT  . Medical history non-contributory     Past Surgical History: Past Surgical History:  Procedure Laterality Date  . DILATION AND EVACUATION N/A 07/20/2019   Procedure: DILATATION AND EVACUATION;  Surgeon: Homero Fellers, MD;  Location: [facility] ORS;  Service: Gynecology;  Laterality: N/A;  . FOREIGN BODY REMOVAL     FEET  . TONSILLECTOMY      Family History:  Family History  Problem Relation Age of Onset  . Ovarian cancer Paternal Grandmother   . Pancreatic cancer Paternal Grandfather    She denies any female cancers, bleeding or blood clotting disorders.   Social History:  Social History   Socioeconomic History  . Marital status: Single    Spouse name: Not on file  . Number of children: Not on file  . Years of education: Not on file  . Highest education level: Not on file  Occupational History  . Not on file  Tobacco Use  . Smoking status: Never Smoker  . Smokeless tobacco: Never Used  Vaping Use  . Vaping Use: Never used  Substance and Sexual Activity  . Alcohol use: Yes    Comment: 2X WEEK  . Drug use: Never  . Sexual activity: Not Currently  Other Topics Concern  . Not on file  Social History Narrative  . Not on file   Social Determinants of Health   Financial Resource Strain: Not on file  Food Insecurity: Not on file  Transportation Needs: Not on file  Physical Activity: Not on file  Stress: Not on file  Social Connections: Not on file  Intimate Partner Violence: Not on file    Allergy: No Known Allergies  Current Outpatient Medications:  Current Outpatient Medications:  .  ibuprofen (ADVIL) 600 MG tablet, Take 1 tablet (600 mg total) by mouth every 6 (six) hours as needed. (Patient not taking: Reported on 07/31/2019), Disp: 20 tablet, Rfl: 0   Physical Exam: Physical Exam Vitals and nursing note reviewed. Exam conducted with a chaperone present.  HENT:      Head: Normocephalic and atraumatic.  Eyes:     Pupils: Pupils are equal, round, and reactive to light.  Neck:     Thyroid: No thyromegaly.  Cardiovascular:     Rate and Rhythm: Normal rate and regular rhythm.  Pulmonary:     Effort: Pulmonary effort is normal.  Abdominal:     General: Bowel sounds are normal. There is no distension.     Palpations: Abdomen is soft.     Tenderness: There is no abdominal tenderness. There is no guarding or rebound.  Musculoskeletal:        General: Normal range of motion.     Cervical back: Normal range of motion and neck supple.  Skin:  General: Skin is warm and dry.  Neurological:     Mental Status: She is alert and oriented to person, place, and time.  Psychiatric:        Mood and Affect: Affect normal.        Judgment: Judgment normal.    Bedside US: IUP CRL 7wk 1 day, with +FHR  Assessment: Ms. Halberg is a 35 y.o. G2P0 [redacted]w[redacted]d based on Patient's last menstrual period was 10/17/2020. with an Estimated Date of Delivery: 07/24/21,  for prenatal care.  Plan:  1) Avoid alcoholic beverages. 2) Patient encouraged not to smoke.  3) Discontinue the use of all non-medicinal drugs and chemicals.  4) Take prenatal vitamins daily.  5) Seatbelt use advised 6) Nutrition, food safety (fish, cheese advisories, and high nitrite foods) and exercise discussed. 7) Hospital and practice style delivering at [facility] discussed  8) Patient is asked about travel to areas at risk for the Zika virus, and counseled to avoid travel and exposure to mosquitoes or sexual partners who may have themselves been exposed to the virus. Testing is discussed, and will be ordered as appropriate.  9) [facility]birth classes at [facility] advised 10) Genetic Screening, such as with 1st Trimester Screening, cell free fetal DNA, AFP testing, and Ultrasound, as well as with amniocentesis and CVS as appropriate, is discussed with patient. She plans to have genetic testing this pregnancy.  She has  been struggling with anxiety and having difficulty sleeping. Provided with list of therapists. Will start zoloft 50 mg at night.  Return for dating [place] Desires weekly [place] for monitoring of FHR until 12 weeks  Problem list reviewed and updated.  I discussed the assessment and treatment plan with the patient. The patient was provided an opportunity to ask questions and all were answered. The patient agreed with the plan and demonstrated an understanding of the instructions.  Adelene Idler MD [facility], [facility] 12/13/2020 2:23 PM

## 2020-12-14 LAB — RPR+RH+ABO+RUB AB+AB SCR+CB...
Antibody Screen: NEGATIVE
HIV Screen 4th Generation wRfx: NONREACTIVE
Hematocrit: 35.4 % (ref 34.0–46.6)
Hemoglobin: 12.2 g/dL (ref 11.1–15.9)
Hepatitis B Surface Ag: NEGATIVE
MCH: 29.3 pg (ref 26.6–33.0)
MCHC: 34.5 g/dL (ref 31.5–35.7)
MCV: 85 fL (ref 79–97)
Platelets: 265 10*3/uL (ref 150–450)
RBC: 4.17 x10E6/uL (ref 3.77–5.28)
RDW: 13.8 % (ref 11.7–15.4)
RPR Ser Ql: NONREACTIVE
Rh Factor: POSITIVE
Rubella Antibodies, IGG: 1.51 index (ref >0.99)
Varicella zoster IgG: 2772 index (ref >165)
WBC: 13.1 10*3/uL — ABNORMAL HIGH (ref 3.4–10.8)

## 2020-12-15 LAB — URINE CULTURE

## 2020-12-17 LAB — CERVICOVAGINAL ANCILLARY ONLY
Chlamydia: NEGATIVE
Comment: NEGATIVE
Comment: NORMAL
Neisseria Gonorrhea: NEGATIVE

## 2020-12-23 ENCOUNTER — Encounter: Payer: Self-pay | Admitting: Obstetrics & Gynecology

## 2020-12-23 ENCOUNTER — Ambulatory Visit: Payer: Self-pay

## 2020-12-23 DIAGNOSIS — Z3481 Encounter for supervision of other normal pregnancy, first trimester: Secondary | ICD-10-CM

## 2020-12-26 ENCOUNTER — Ambulatory Visit (INDEPENDENT_AMBULATORY_CARE_PROVIDER_SITE_OTHER): Payer: BC Managed Care – PPO

## 2020-12-26 ENCOUNTER — Ambulatory Visit (INDEPENDENT_AMBULATORY_CARE_PROVIDER_SITE_OTHER): Payer: Self-pay | Admitting: Obstetrics and Gynecology

## 2020-12-26 ENCOUNTER — Other Ambulatory Visit: Payer: Self-pay

## 2020-12-26 VITALS — BP 120/80 | Wt 173.0 lb

## 2020-12-26 DIAGNOSIS — Z3481 Encounter for supervision of other normal pregnancy, first trimester: Secondary | ICD-10-CM

## 2020-12-26 DIAGNOSIS — Z3A10 10 weeks gestation of pregnancy: Secondary | ICD-10-CM

## 2020-12-26 LAB — POCT URINALYSIS DIPSTICK OB
Glucose, UA: NEGATIVE
POC,PROTEIN,UA: NEGATIVE

## 2020-12-26 NOTE — Progress Notes (Signed)
Routine Prenatal Care Visit  Subjective  Mia Williams is a 35 y.o. G2P0 at [redacted]w[redacted]d being seen today for ongoing prenatal care.  She is currently monitored for the following issues for this low-risk pregnancy and has Supervision of normal pregnancy on their problem list.  ----------------------------------------------------------------------------------- Patient reports no complaints.    . Vag. Bleeding: None.   . Denies leaking of fluid.  ----------------------------------------------------------------------------------- The following portions of the patient's history were reviewed and updated as appropriate: allergies, current medications, past family history, past medical history, past social history, past surgical history and problem list. Problem list updated.   Objective  Blood pressure 120/80, weight 173 lb (78.5 kg), last menstrual period 10/17/2020, unknown if currently breastfeeding. Pregravid weight 168 lb (76.2 kg) Total Weight Gain 5 lb (2.268 kg) Urinalysis:      Fetal Status: Fetal Heart Rate (bpm): 175 ([place])          Dating [place]: Findings:  Singleton intrauterine pregnancy is visualized with a CRL consistent with [redacted]w[redacted]d gestation, giving an (U/S) EDD of 07/27/2021. The (U/S) EDD is consistent with the clinically established EDD of 07/24/2021. FHR: 175 BPM CRL measurement: 27.1 mm Yolk sac is visualized and appears normal. Amnion: visualized and appears normal  Right Ovary is normal in appearance. Left Ovary is normal appearance. Corpus luteal cyst:  Left ovary Survey of the adnexa demonstrates no adnexal masses. There is no free peritoneal fluid in the cul de sac.  General:  Alert, oriented and cooperative. Patient is in no acute distress.  Skin: Skin is warm and dry. No rash noted.   Cardiovascular: Normal heart rate noted  Respiratory: Normal respiratory effort, no problems with respiration noted  Abdomen: Soft, gravid, appropriate for gestational age.  Pain/Pressure: Absent     Pelvic:  Cervical exam deferred        Extremities: Normal range of motion.     ental Status: Normal mood and affect. Normal behavior. Normal judgment and thought content.     Assessment   35 y.o. G2P0 at [redacted]w[redacted]d by  07/24/2021, by Last Menstrual Period presenting for routine prenatal visit  Plan   PREGNANCY 2 Problems (from 12/13/20 to present)    Problem Noted Resolved   Supervision of normal pregnancy 12/13/2020 by Natale Milch, MD No   Overview Addendum 12/26/2020  9:48 PM by [name], CNM     Nursing Staff Provider  Office Location  [facility] Dating   LMP = 10 [place]  Language  English Anatomy US    Flu Vaccine   Genetic Screen  NIPS:   Collect at 12 wk visit  TDaP vaccine    Hgb A1C or  GTT Third trimester :   Rhogam   n/a   LAB RESULTS   Feeding Plan  Blood Type A/Positive/-- (01/07 1501)   Contraception  Antibody Negative (01/07 1501)  Circumcision  Rubella 1.51 (01/07 1501)  Pediatrician   RPR Non Reactive (01/07 1501)   Support Person  HBsAg Negative (01/07 1501)   Prenatal Classes  HIV Non Reactive (01/07 1501)    Varicella   BTL Consent  n/a GBS  (For PCN allergy, check sensitivities)        VBAC Consent  n/a Pap  06/21/19 - NILM    Hgb Electro      CF      SMA               Previous Version      -Reviewed dating US  findings with patient -Reviewed plan made with Dr. Jerene Pitch at last visit for regular visits/US for maternal reassurance/anxiety during the first trimester. Patient feels reassured following today's imaging. Plan made to RTC in 2 weeks for ROB with [place].  First trimester precautions including but not limited to vaginal bleeding, contractions, leaking of fluid and fetal movement were reviewed in detail with the patient.    Return in about 2 weeks (around 01/09/2021) for ROB with dating [place].  [name], CNM, MSN [facility], [facility] 12/26/2020, 9:51 PM

## 2021-01-02 ENCOUNTER — Encounter: Payer: Self-pay | Admitting: Obstetrics and Gynecology

## 2021-01-10 ENCOUNTER — Ambulatory Visit (INDEPENDENT_AMBULATORY_CARE_PROVIDER_SITE_OTHER): Payer: BC Managed Care – PPO | Admitting: Obstetrics and Gynecology

## 2021-01-10 ENCOUNTER — Ambulatory Visit (INDEPENDENT_AMBULATORY_CARE_PROVIDER_SITE_OTHER): Payer: BC Managed Care – PPO

## 2021-01-10 ENCOUNTER — Other Ambulatory Visit: Payer: Self-pay

## 2021-01-10 VITALS — BP 140/80 | Wt 169.0 lb

## 2021-01-10 DIAGNOSIS — Z3481 Encounter for supervision of other normal pregnancy, first trimester: Secondary | ICD-10-CM

## 2021-01-10 DIAGNOSIS — Z3A12 12 weeks gestation of pregnancy: Secondary | ICD-10-CM

## 2021-01-10 LAB — POCT URINALYSIS DIPSTICK OB
Glucose, UA: NEGATIVE
POC,PROTEIN,UA: NEGATIVE

## 2021-01-10 NOTE — Progress Notes (Signed)
Routine Prenatal Care Visit  Subjective  Mia Williams is a 35 y.o. G2P0 at [redacted]w[redacted]d being seen today for ongoing prenatal care.  She is currently monitored for the following issues for this low-risk pregnancy and has Supervision of normal pregnancy on their problem list.  ----------------------------------------------------------------------------------- Patient reports no complaints.  Patient is excited to tell parents and family about the pregnancy this weekend (for father's birthday and cousins baby shower).  . Vag. Bleeding: None.   . Denies leaking of fluid.  ----------------------------------------------------------------------------------- The following portions of the patient's history were reviewed and updated as appropriate: allergies, current medications, past family history, past medical history, past social history, past surgical history and problem list. Problem list updated.   Objective  Blood pressure 140/80, weight 169 lb (76.7 kg), last menstrual period 10/17/2020, unknown if currently breastfeeding. Pregravid weight 168 lb (76.2 kg) Total Weight Gain 1 lb (0.454 kg) Urinalysis:      Fetal Status: Fetal Heart Rate (bpm): 165          First trimester [place]:  Singleton intrauterine pregnancy is visualized with a CRL consistent with [redacted]w[redacted]d gestation, giving an (U/S) EDD of 07/28/2021. The (U/S) EDD is consistent with the clinically established EDD of 07/24/2021. FHR: 167 BPM CRL measurement: 47.9 mm Yolk sac is not visualized. Amnion: visualized and appears normal  Right Ovary is normal in appearance. Left Ovary is normal appearance. Corpus luteal cyst:  Left ovary Survey of the adnexa demonstrates no adnexal masses. There is no free peritoneal fluid in the cul de sac.   General:  Alert, oriented and cooperative. Patient is in no acute distress.  Skin: Skin is warm and dry. No rash noted.   Cardiovascular: Normal heart rate noted  Respiratory: Normal respiratory  effort, no problems with respiration noted  Abdomen: Soft, gravid, appropriate for gestational age. Pain/Pressure: Absent     Pelvic:  Cervical exam deferred        Extremities: Normal range of motion.     ental Status: Normal mood and affect. Normal behavior. Normal judgment and thought content.     Assessment   35 y.o. G2P0 at [redacted]w[redacted]d by  07/24/2021, by Last Menstrual Period presenting for routine prenatal visit  Plan   PREGNANCY 2 Problems (from 12/13/20 to present)    Problem Noted Resolved   Supervision of normal pregnancy 12/13/2020 by Natale Milch, MD No   Overview Addendum 12/26/2020  9:48 PM by [name], CNM     Nursing Staff Provider  Office Location  [facility] Dating   LMP = 10 [place]  Language  English Anatomy US    Flu Vaccine   Genetic Screen  NIPS:   Collect at 12 wk visit  TDaP vaccine    Hgb A1C or  GTT Third trimester :   Rhogam   n/a   LAB RESULTS   Feeding Plan  Blood Type A/Positive/-- (01/07 1501)   Contraception  Antibody Negative (01/07 1501)  Circumcision  Rubella 1.51 (01/07 1501)  Pediatrician   RPR Non Reactive (01/07 1501)   Support Person  HBsAg Negative (01/07 1501)   Prenatal Classes  HIV Non Reactive (01/07 1501)    Varicella   BTL Consent  n/a GBS  (For PCN allergy, check sensitivities)        VBAC Consent  n/a Pap  06/21/19 - NILM    Hgb Electro      CF      SMA  Previous Version       -Reviewed US findings from today's visit - consistent with EDD -Discussed routine prenatal visit intervals - patient currently desires f/u in 2 weeks  First trimester precautions including but not limited to vaginal bleeding, contractions, leaking of fluid and fetal movement were reviewed in detail with the patient.    Return in about 2 weeks (around 01/24/2021) for ROB.  [name], CNM, MSN [facility], [facility] 01/10/2021, 4:12 PM

## 2021-01-28 ENCOUNTER — Encounter: Payer: Self-pay | Admitting: Obstetrics and Gynecology

## 2021-01-28 ENCOUNTER — Other Ambulatory Visit: Payer: Self-pay

## 2021-01-28 ENCOUNTER — Ambulatory Visit (INDEPENDENT_AMBULATORY_CARE_PROVIDER_SITE_OTHER): Payer: BC Managed Care – PPO | Admitting: Obstetrics and Gynecology

## 2021-01-28 VITALS — BP 136/82 | Wt 168.0 lb

## 2021-01-28 DIAGNOSIS — Z3481 Encounter for supervision of other normal pregnancy, first trimester: Secondary | ICD-10-CM

## 2021-01-28 DIAGNOSIS — Z3A14 14 weeks gestation of pregnancy: Secondary | ICD-10-CM

## 2021-01-28 DIAGNOSIS — Z3689 Encounter for other specified antenatal screening: Secondary | ICD-10-CM

## 2021-01-28 NOTE — Progress Notes (Signed)
    Routine Prenatal Care Visit  Subjective  Mia Williams is a 35 y.o. G2P0 at [redacted]w[redacted]d being seen today for ongoing prenatal care.  She is currently monitored for the following issues for this low-risk pregnancy and has Supervision of normal pregnancy on their problem list.  ----------------------------------------------------------------------------------- Patient reports no complaints.  Patient reports feeling fatigue but that other sx of pregnancy have decreased, including N/V.  . Vag. Bleeding: None.   . Denies leaking of fluid.  ----------------------------------------------------------------------------------- The following portions of the patient's history were reviewed and updated as appropriate: allergies, current medications, past family history, past medical history, past social history, past surgical history and problem list. Problem list updated.   Objective  Blood pressure 136/82, weight 168 lb (76.2 kg), last menstrual period 10/17/2020, unknown if currently breastfeeding. Pregravid weight 168 lb (76.2 kg) Total Weight Gain 0 lb (0 kg) Urinalysis:      Fetal Status: Fetal Heart Rate (bpm): 145         General:  Alert, oriented and cooperative. Patient is in no acute distress.  Skin: Skin is warm and dry. No rash noted.   Cardiovascular: Normal heart rate noted  Respiratory: Normal respiratory effort, no problems with respiration noted  Abdomen: Soft, gravid, appropriate for gestational age. Pain/Pressure: Absent     Pelvic:  Cervical exam deferred        Extremities: Normal range of motion.     ental Status: Normal mood and affect. Normal behavior. Normal judgment and thought content.     Assessment   36 y.o. G2P0 at [redacted]w[redacted]d by  07/24/2021, by Last Menstrual Period presenting for routine prenatal visit  Plan   PREGNANCY 2 Problems (from 12/13/20 to present)    Problem Noted Resolved   Supervision of normal pregnancy 12/13/2020 by Natale Milch, MD No    Overview Addendum 12/26/2020  9:48 PM by [name], CNM     Nursing Staff Provider  Office Location  [facility] Dating   LMP = 10 [place]  Language  English Anatomy US    Flu Vaccine   Genetic Screen  NIPS:  declines  TDaP vaccine    Hgb A1C or  GTT Third trimester :   Rhogam   n/a   LAB RESULTS   Feeding Plan  Blood Type A/Positive/-- (01/07 1501)   Contraception  Antibody Negative (01/07 1501)  Circumcision  Rubella 1.51 (01/07 1501)  Pediatrician   RPR Non Reactive (01/07 1501)   Support Person  HBsAg Negative (01/07 1501)   Prenatal Classes  HIV Non Reactive (01/07 1501)    Varicella   BTL Consent  n/a GBS  (For PCN allergy, check sensitivities)        VBAC Consent  n/a Pap  06/21/19 - NILM    Hgb Electro      CF      SMA               Previous Version      -Reviewed normal changes in sx of pregnancy during the second trimester -Reviewed process for anatomy scan at next visit  Second trimester precautions including but not limited to vaginal bleeding, contractions, leaking of fluid and fetal movement were reviewed in detail with the patient.    Return in about 4 weeks (around 02/25/2021) for ROB with anatomy scan.  [name], CNM, MSN [facility], [facility] 01/28/2021, 4:50 PM

## 2021-01-28 NOTE — Progress Notes (Signed)
136/82

## 2021-02-24 ENCOUNTER — Telehealth: Payer: Self-pay

## 2021-02-24 NOTE — Telephone Encounter (Signed)
Pt calling; is 18wks; went to Urgent Care yesterday for sinus inf.  They rx'd amoxicillin.  Is this okay to take during preg.?  [phone]  Adv amox is okay to take as long as not allergic to it.

## 2021-02-25 ENCOUNTER — Ambulatory Visit (INDEPENDENT_AMBULATORY_CARE_PROVIDER_SITE_OTHER): Payer: BC Managed Care – PPO | Admitting: Advanced Practice Midwife

## 2021-02-25 ENCOUNTER — Other Ambulatory Visit: Payer: Self-pay

## 2021-02-25 ENCOUNTER — Ambulatory Visit (INDEPENDENT_AMBULATORY_CARE_PROVIDER_SITE_OTHER): Payer: BC Managed Care – PPO

## 2021-02-25 ENCOUNTER — Encounter: Payer: Self-pay | Admitting: Advanced Practice Midwife

## 2021-02-25 VITALS — BP 128/88 | Wt 170.0 lb

## 2021-02-25 DIAGNOSIS — Z3A18 18 weeks gestation of pregnancy: Secondary | ICD-10-CM

## 2021-02-25 DIAGNOSIS — Z3482 Encounter for supervision of other normal pregnancy, second trimester: Secondary | ICD-10-CM

## 2021-02-25 DIAGNOSIS — Z3689 Encounter for other specified antenatal screening: Secondary | ICD-10-CM | POA: Diagnosis not present

## 2021-02-25 DIAGNOSIS — Z369 Encounter for antenatal screening, unspecified: Secondary | ICD-10-CM

## 2021-02-25 NOTE — Progress Notes (Signed)
U/s today. No vb. No lof.  

## 2021-02-25 NOTE — Progress Notes (Signed)
  Routine Prenatal Care Visit  Subjective  Mia Williams is a 35 y.o. G2P0 at [redacted]w[redacted]d being seen today for ongoing prenatal care.  She is currently monitored for the following issues for this low-risk pregnancy and has Supervision of normal pregnancy on their problem list.  ----------------------------------------------------------------------------------- Patient reports no complaints.    . Vag. Bleeding: None.  Movement: Present. Leaking Fluid denies.  ----------------------------------------------------------------------------------- The following portions of the patient's history were reviewed and updated as appropriate: allergies, current medications, past family history, past medical history, past social history, past surgical history and problem list. Problem list updated.  Objective  Blood pressure 128/88, weight 170 lb (77.1 kg), last menstrual period 10/17/2020 Pregravid weight 168 lb (76.2 kg) Total Weight Gain 2 lb (0.907 kg) Urinalysis: Urine Protein    Urine Glucose    Fetal Status: Fetal Heart Rate (bpm): 137 Fundal Height: 19 cm Movement: Present      Anatomy scan: incomplete for nose/lips/ductal arch and otherwise complete, normal, cephalic, female, placenta anterior with marginal cord insertion.  General:  Alert, oriented and cooperative. Patient is in no acute distress.  Skin: Skin is warm and dry. No rash noted.   Cardiovascular: Normal heart rate noted  Respiratory: Normal respiratory effort, no problems with respiration noted  Abdomen: Soft, gravid, appropriate for gestational age. Pain/Pressure: Absent     Pelvic:  Cervical exam deferred        Extremities: Normal range of motion.  Edema: None  Mental Status: Normal mood and affect. Normal behavior. Normal judgment and thought content.   Assessment   35 y.o. G2P0 at [redacted]w[redacted]d by  07/24/2021, by Last Menstrual Period presenting for routine prenatal visit  Plan   PREGNANCY 2 Problems (from 12/13/20 to present)     Problem Noted Resolved   Supervision of normal pregnancy 12/13/2020 by Natale Milch, MD No   Overview Addendum 01/28/2021  4:51 PM by [name], CNM     Nursing Staff Provider  Office Location  [facility] Dating   LMP = 10 [place]  Language  English Anatomy US    Flu Vaccine   Genetic Screen  NIPS:  declines  TDaP vaccine    Hgb A1C or  GTT Third trimester :   Rhogam   n/a   LAB RESULTS   Feeding Plan  Blood Type A/Positive/-- (01/07 1501)   Contraception  Antibody Negative (01/07 1501)  Circumcision  Rubella 1.51 (01/07 1501)  Pediatrician   RPR Non Reactive (01/07 1501)   Support Person  HBsAg Negative (01/07 1501)   Prenatal Classes  HIV Non Reactive (01/07 1501)    Varicella   BTL Consent  n/a GBS  (For PCN allergy, check sensitivities)        VBAC Consent  n/a Pap  06/21/19 - NILM    Hgb Electro      CF      SMA               Previous Version       Preterm labor symptoms and general obstetric precautions including but not limited to vaginal bleeding, contractions, leaking of fluid and fetal movement were reviewed in detail with the patient.    Return in about 4 weeks (around 03/25/2021) for rob and needs f/u anatomy at [facility] (order placed).  Tresea Mall, CNM 02/25/2021 3:31 PM

## 2021-02-26 ENCOUNTER — Other Ambulatory Visit: Payer: Self-pay

## 2021-03-26 ENCOUNTER — Encounter: Payer: BC Managed Care – PPO | Admitting: Obstetrics and Gynecology

## 2021-03-26 ENCOUNTER — Ambulatory Visit: Payer: BC Managed Care – PPO

## 2021-03-26 DIAGNOSIS — Z3482 Encounter for supervision of other normal pregnancy, second trimester: Secondary | ICD-10-CM

## 2021-03-26 DIAGNOSIS — Z369 Encounter for antenatal screening, unspecified: Secondary | ICD-10-CM

## 2021-04-02 ENCOUNTER — Ambulatory Visit
Admission: RE | Admit: 2021-04-02 | Discharge: 2021-04-02 | Disposition: A | Discharge status: Home / Self Care | Payer: BC Managed Care – PPO | Source: Ambulatory Visit | Attending: Advanced Practice Midwife | Admitting: Advanced Practice Midwife

## 2021-04-02 ENCOUNTER — Other Ambulatory Visit: Payer: Self-pay

## 2021-04-02 DIAGNOSIS — Z369 Encounter for antenatal screening, unspecified: Secondary | ICD-10-CM | POA: Insufficient documentation

## 2021-04-02 DIAGNOSIS — Z3482 Encounter for supervision of other normal pregnancy, second trimester: Secondary | ICD-10-CM | POA: Insufficient documentation

## 2021-04-03 ENCOUNTER — Encounter: Payer: Self-pay | Admitting: Obstetrics and Gynecology

## 2021-04-03 ENCOUNTER — Ambulatory Visit (INDEPENDENT_AMBULATORY_CARE_PROVIDER_SITE_OTHER): Payer: BC Managed Care – PPO | Admitting: Obstetrics and Gynecology

## 2021-04-03 VITALS — BP 128/80 | Wt 172.0 lb

## 2021-04-03 DIAGNOSIS — O365990 Maternal care for other known or suspected poor fetal growth, unspecified trimester, not applicable or unspecified: Secondary | ICD-10-CM

## 2021-04-03 DIAGNOSIS — Z3A24 24 weeks gestation of pregnancy: Secondary | ICD-10-CM

## 2021-04-03 DIAGNOSIS — Z3482 Encounter for supervision of other normal pregnancy, second trimester: Secondary | ICD-10-CM

## 2021-04-03 DIAGNOSIS — O0992 Supervision of high risk pregnancy, unspecified, second trimester: Secondary | ICD-10-CM

## 2021-04-03 DIAGNOSIS — O26849 Uterine size-date discrepancy, unspecified trimester: Secondary | ICD-10-CM

## 2021-04-03 LAB — POCT URINALYSIS DIPSTICK OB
Glucose, UA: NEGATIVE
POC,PROTEIN,UA: NEGATIVE

## 2021-04-03 NOTE — Progress Notes (Addendum)
Routine Prenatal Care Visit  Subjective  Mia Williams is a 35 y.o. G2P0010 at [redacted]w[redacted]d being seen today for ongoing prenatal care.  She is currently monitored for the following issues for this high-risk pregnancy and has Supervision of normal pregnancy on their problem list.  ----------------------------------------------------------------------------------- Patient reports concern regarding results of [place] yesterday. Contractions: Not present. Vag. Bleeding: None.  Movement: Present. Denies leaking of fluid.  ----------------------------------------------------------------------------------- The following portions of the patient's history were reviewed and updated as appropriate: allergies, current medications, past family history, past medical history, past social history, past surgical history and problem list. Problem list updated.   Objective  Blood pressure 128/80, weight 172 lb (78 kg), last menstrual period 10/17/2020, unknown if currently breastfeeding. Pregravid weight 168 lb (76.2 kg) Total Weight Gain 4 lb (1.814 kg) Urinalysis:      NST: Indication - Severe IUGR  Baseline - 130 bmp, moderate variability, -accels, -decels - appropriate for early gestation  Fetal Status: Fetal Heart Rate (bpm): 130 Fundal Height: 22 cm Movement: Present     General:  Alert, oriented and cooperative. Patient is in no acute distress.  Skin: Skin is warm and dry. No rash noted.   Cardiovascular: Normal heart rate noted  Respiratory: Normal respiratory effort, no problems with respiration noted  Abdomen: Soft, gravid, appropriate for gestational age. Pain/Pressure: Absent     Pelvic:  Cervical exam deferred        Extremities: Normal range of motion.     ental Status: Normal mood and affect. Normal behavior. Normal judgment and thought content.     Assessment   35 y.o. G2P0010 at [redacted]w[redacted]d by  07/24/2021, by Last Menstrual Period presenting for routine prenatal visit  Plan   PREGNANCY 2  Problems (from 12/13/20 to present)    Problem Noted Resolved   Supervision of normal pregnancy 12/13/2020 by Natale Milch, MD No   Overview Addendum 01/28/2021  4:51 PM by [name], CNM     Nursing Staff Provider  Office Location  [facility] Dating   LMP = 10 [place]  Language  English Anatomy US  marginal cord insertion - f/u at 24 wks EFW <0.5% - MFM referral placed - UA dop ordered  Flu Vaccine   declined Genetic Screen  NIPS:  declines  TDaP vaccine    Hgb A1C or  GTT Third trimester :   Rhogam   n/a   LAB RESULTS   Feeding Plan  breast Blood Type A/Positive/-- (01/07 1501)   Contraception  Antibody Negative (01/07 1501)  Circumcision  Rubella 1.51 (01/07 1501)  Pediatrician   RPR Non Reactive (01/07 1501)   Support Person  husband HBsAg Negative (01/07 1501)   Prenatal Classes  information provided HIV Non Reactive (01/07 1501)    Varicella   BTL Consent  n/a GBS  (For PCN allergy, check sensitivities)        VBAC Consent  n/a Pap  06/21/19 - NILM    Hgb Electro      CF      SMA               Previous Version      -Reviewed f/u US from yesterday at [facility] with patient and husband - noted that fetal growth is estimated to be three weeks behind EDD. Discussed findings in person with Dr. Jerene Pitch who reviewed the images - EFW <0.5%. Dr. Jerene Pitch spoke with Dr. Grace Bushy (MFM) via phone to review US findings and discuss management  of severe IUGR at this early gestational age. Plan made for patient to f/u with MFM within the week for UA dopplers and MFM consult. -NST obtained per Dr. Ivar Bury recommendations - reviewed with patient -Plan made to RTC next with for visit with MD at [facility], MFM appointment scheduled 4/30 at 3:30. -Discussed NIPS testing with patient - as recommended by Dr. Grace Bushy - she declined at this time and would prefer to have a further conversation about it with MFM  Second trimester precautions including but not limited to vaginal bleeding, contractions,  leaking of fluid and fetal movement were reviewed in detail with the patient.    Return in about 1 week (around 04/10/2021) for HROB with MD (Dr. Jerene Pitch please).  [name], CNM, MSN [facility], [facility] 04/04/2021, 8:24 AM

## 2021-04-04 ENCOUNTER — Encounter: Payer: Self-pay | Admitting: *Deleted

## 2021-04-04 ENCOUNTER — Other Ambulatory Visit: Payer: Self-pay | Admitting: Obstetrics and Gynecology

## 2021-04-04 ENCOUNTER — Ambulatory Visit (HOSPITAL_BASED_OUTPATIENT_CLINIC_OR_DEPARTMENT_OTHER): Payer: BC Managed Care – PPO | Admitting: *Deleted

## 2021-04-04 ENCOUNTER — Ambulatory Visit: Payer: BC Managed Care – PPO | Attending: Maternal & Fetal Medicine

## 2021-04-04 ENCOUNTER — Other Ambulatory Visit: Payer: Self-pay

## 2021-04-04 DIAGNOSIS — O26849 Uterine size-date discrepancy, unspecified trimester: Secondary | ICD-10-CM | POA: Insufficient documentation

## 2021-04-04 DIAGNOSIS — O359XX0 Maternal care for (suspected) fetal abnormality and damage, unspecified, not applicable or unspecified: Secondary | ICD-10-CM | POA: Insufficient documentation

## 2021-04-04 DIAGNOSIS — Z3A24 24 weeks gestation of pregnancy: Secondary | ICD-10-CM | POA: Insufficient documentation

## 2021-04-04 DIAGNOSIS — O09892 Supervision of other high risk pregnancies, second trimester: Secondary | ICD-10-CM | POA: Insufficient documentation

## 2021-04-04 DIAGNOSIS — O365920 Maternal care for other known or suspected poor fetal growth, second trimester, not applicable or unspecified: Secondary | ICD-10-CM | POA: Insufficient documentation

## 2021-04-04 DIAGNOSIS — Z363 Encounter for antenatal screening for malformations: Secondary | ICD-10-CM | POA: Diagnosis not present

## 2021-04-06 NOTE — Progress Notes (Signed)
MFM Consult Note  Mia Williams was seen for a detailed fetal anatomy scan and consultation as she had a recent ultrasound performed by radiology at [facility] that showed that the fetus was measuring about 3 weeks behind her dates.  The patient reports that she had a prior fetal anatomy scan performed at [facility] where all of the views of the fetal anatomy were unable to be fully obtained.  She denies any significant past medical history and denies any problems in her current pregnancy.    She has declined all screening tests for fetal aneuploidy in her current pregnancy.  The patient reports that her [facility] of July 24, 2021 was confirmed by a first trimester ultrasound.  Based on the fetal biometry measurements obtained today, the fetus is measuring about 3 weeks behind her dates.  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   The patient and her husband were advised that the duodenum is the first part of the small intestine.  Duodenal atresia occurs when there is a blockage in the first part of the small intestine that prevents the contents of the stomach to pass.  Duodenal atresia can be an isolated condition but is also commonly seen in infants with Down syndrome.  They were advised that a baby that is born with duodenal atresia will require surgery after birth to repair the blockage and repair of the duodenum.  They were also advised that the echogenic bowel noted today may represent fetal gut malformations.  However, echogenic bowel may also be a sign of fetal aneuploidy, swallowed  blood, cystic fibrosis, and viral infections. She denies any recent vaginal bleeding. She does not know if she has been screened for cystic fibrosis.  The patient and her husband were advised that today's ultrasound findings (duodenal atresia, echogenic bowel, severe IUGR) indicates that her fetus is at high risk for Down  syndrome.   The availability of an amniocentesis and the potential risks (pregnancy loss 1:300-600) and benefits of this procedure were discussed and questions were answered.  The patient declined the procedure today. As she has not had any screening tests drawn in her current pregnancy, she was also advised that she may have a noninvasive cell free DNA test drawn to screen for fetal aneuploidy.   Doppler studies of the umbilical arteries performed today due to IUGR shows absent end-diastolic flow.  A small anterior placenta was noted today.    Due to the abnormal umbilical artery Doppler studies and severe IUGR, the increased risk of a fetal demise was discussed.  They were advised that a fetus is considered viable if the EFW is 400 g or greater and the gestational age is at between 62 to 24 weeks.  Although she is at 24 weeks, the EFW obtained today is less than 400 g.  Sometimes a fetal demise will occur before the fetus reaches 400 g.  She was advised to monitor fetal kick counts on a daily basis.  As the couple were overwhelmed with the information that was presented to them today, they will consider the amniocentesis and the cell free DNA test this weekend and will call me next week should they want either test. They will also consider other tests to screen for CMV and toxoplasmosis due to the echogenic bowel noted today.  The limitations of ultrasound in the detection of all anomalies was discussed today.   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    We will continue to follow her closely.  I will contact the patient early next week to determine if  they want an amniocentesis or a cell free DNA test.  We will see her for weekly exams to assess the umbilical artery Doppler studies.  The patient and her husband stated that all of their questions have been answered today.  A total of 60 minutes was spent counseling and coordinating the care for this patient.  Greater than 50% of the time was spent in direct face-to-face contact.

## 2021-04-07 ENCOUNTER — Telehealth: Payer: Self-pay | Admitting: Obstetrics and Gynecology

## 2021-04-07 ENCOUNTER — Other Ambulatory Visit: Payer: Self-pay | Admitting: *Deleted

## 2021-04-07 DIAGNOSIS — O365920 Maternal care for other known or suspected poor fetal growth, second trimester, not applicable or unspecified: Secondary | ICD-10-CM

## 2021-04-07 NOTE — Telephone Encounter (Signed)
I called patient to follow up on MFM visit. Not able to reach patient, left a message.  Adelene Idler MD, [facility], [facility] 04/07/2021 11:52 AM

## 2021-04-10 ENCOUNTER — Ambulatory Visit: Payer: BC Managed Care – PPO | Admitting: *Deleted

## 2021-04-10 ENCOUNTER — Ambulatory Visit: Payer: BC Managed Care – PPO

## 2021-04-10 ENCOUNTER — Encounter: Payer: Self-pay | Admitting: *Deleted

## 2021-04-10 ENCOUNTER — Ambulatory Visit: Payer: BC Managed Care – PPO | Attending: Obstetrics and Gynecology

## 2021-04-10 ENCOUNTER — Other Ambulatory Visit: Payer: Self-pay

## 2021-04-10 DIAGNOSIS — O43192 Other malformation of placenta, second trimester: Secondary | ICD-10-CM

## 2021-04-10 DIAGNOSIS — O359XX0 Maternal care for (suspected) fetal abnormality and damage, unspecified, not applicable or unspecified: Secondary | ICD-10-CM

## 2021-04-10 DIAGNOSIS — O09892 Supervision of other high risk pregnancies, second trimester: Secondary | ICD-10-CM | POA: Insufficient documentation

## 2021-04-10 DIAGNOSIS — O365920 Maternal care for other known or suspected poor fetal growth, second trimester, not applicable or unspecified: Secondary | ICD-10-CM

## 2021-04-10 DIAGNOSIS — O321XX0 Maternal care for breech presentation, not applicable or unspecified: Secondary | ICD-10-CM

## 2021-04-10 DIAGNOSIS — Z3A25 25 weeks gestation of pregnancy: Secondary | ICD-10-CM

## 2021-04-14 ENCOUNTER — Encounter: Payer: Self-pay | Admitting: Obstetrics and Gynecology

## 2021-04-14 ENCOUNTER — Telehealth: Payer: Self-pay

## 2021-04-14 ENCOUNTER — Ambulatory Visit (INDEPENDENT_AMBULATORY_CARE_PROVIDER_SITE_OTHER): Payer: BC Managed Care – PPO | Admitting: Obstetrics and Gynecology

## 2021-04-14 ENCOUNTER — Other Ambulatory Visit: Payer: Self-pay

## 2021-04-14 VITALS — BP 140/78 | Ht 63.0 in | Wt 174.2 lb

## 2021-04-14 DIAGNOSIS — O09892 Supervision of other high risk pregnancies, second trimester: Secondary | ICD-10-CM

## 2021-04-14 DIAGNOSIS — Z3A25 25 weeks gestation of pregnancy: Secondary | ICD-10-CM

## 2021-04-14 DIAGNOSIS — O0992 Supervision of high risk pregnancy, unspecified, second trimester: Secondary | ICD-10-CM

## 2021-04-14 LAB — POCT URINALYSIS DIPSTICK OB
Glucose, UA: NEGATIVE
POC,PROTEIN,UA: NEGATIVE

## 2021-04-14 NOTE — Telephone Encounter (Signed)
FETAL ECHO COMPLETED 04/14/2021.

## 2021-04-14 NOTE — Progress Notes (Signed)
popoc

## 2021-04-14 NOTE — Progress Notes (Signed)
Routine Prenatal Care Visit  Subjective  Mia Williams is a 35 y.o. G2P0010 at [redacted]w[redacted]d being seen today for ongoing prenatal care.  She is currently monitored for the following issues for this high-risk pregnancy and has Supervision of other high risk pregnancies, second trimester on their problem list.  ----------------------------------------------------------------------------------- Patient reports no complaints.   Contractions: Not present. Vag. Bleeding: None.  Movement: Present. Denies leaking of fluid.  ----------------------------------------------------------------------------------- The following portions of the patient's history were reviewed and updated as appropriate: allergies, current medications, past family history, past medical history, past social history, past surgical history and problem list. Problem list updated.   Objective  Blood pressure 140/78, height 5\' 3"  (1.6 m), weight 174 lb 3.2 oz (79 kg), last menstrual period 10/17/2020, unknown if currently breastfeeding. Pregravid weight 168 lb (76.2 kg) Total Weight Gain 6 lb 3.2 oz (2.812 kg) Urinalysis:      Fetal Status: Fetal Heart Rate (bpm): 135   Movement: Present     General:  Alert, oriented and cooperative. Patient is in no acute distress.  Skin: Skin is warm and dry. No rash noted.   Cardiovascular: Normal heart rate noted  Respiratory: Normal respiratory effort, no problems with respiration noted  Abdomen: Soft, gravid, appropriate for gestational age. Pain/Pressure: Absent     Pelvic:  Cervical exam deferred        Extremities: Normal range of motion.  Edema: None  Mental Status: Normal mood and affect. Normal behavior. Normal judgment and thought content.     Assessment   35 y.o. G2P0010 at [redacted]w[redacted]d by  07/24/2021, by Last Menstrual Period presenting for routine prenatal visit  Plan   PREGNANCY 2 Problems (from 12/13/20 to present)    Problem Noted Resolved   Supervision of other high risk  pregnancies, second trimester 12/13/2020 by 02/10/2021, MD No   Overview Addendum 04/04/2021  8:27 AM by 04/06/2021, CNM     Nursing Staff Provider  Office Location  [facility] Dating   LMP = 10 Zipporah Williams  Language  English Anatomy US   marginal cord insertion - f/u at 24 wks EFW <0.5% - MFM referral placed - UA dop ordered  Flu Vaccine   Genetic Screen  NIPS:  declines  TDaP vaccine    Hgb A1C or  GTT Third trimester :   Rhogam   n/a   LAB RESULTS   Feeding Plan  breast Blood Type A/Positive/-- (01/07 1501)   Contraception  Antibody Negative (01/07 1501)  Circumcision  Rubella 1.51 (01/07 1501)  Pediatrician   RPR Non Reactive (01/07 1501)   Support Person  husband HBsAg Negative (01/07 1501)   Prenatal Classes  information provided HIV Non Reactive (01/07 1501)    Varicella   BTL Consent  n/a GBS  (For PCN allergy, check sensitivities)        VBAC Consent  n/a Pap  06/21/19 - NILM    Hgb Electro      CF      SMA               Previous Version       Reports fetal echo today was largely WNL and repeat planned for 5 weeks secondary to a small defect seen.  Is feeling increased movements.  Weekly follow up with MFM is planned She is considering pediatric surgery referral. Considering [facility] or [facility] and transfer of care before delivery, she will discuss further with Dr. 06/23/19 this week.  28 week  labs next visit  Gestational age appropriate obstetric precautions including but not limited to vaginal bleeding, contractions, leaking of fluid and fetal movement were reviewed in detail with the patient.    Return in about 2 weeks (around 04/28/2021) for ROB in person with MD and 1 GTT.  Natale Milch MD [facility], [facility] 04/14/2021, 4:32 PM

## 2021-04-17 ENCOUNTER — Telehealth: Payer: Self-pay | Admitting: Genetic Counselor

## 2021-04-17 ENCOUNTER — Other Ambulatory Visit: Payer: Self-pay

## 2021-04-17 ENCOUNTER — Other Ambulatory Visit: Payer: Self-pay | Admitting: Obstetrics

## 2021-04-17 ENCOUNTER — Ambulatory Visit: Payer: BC Managed Care – PPO

## 2021-04-17 ENCOUNTER — Ambulatory Visit: Payer: BC Managed Care – PPO | Attending: Obstetrics

## 2021-04-17 ENCOUNTER — Ambulatory Visit (HOSPITAL_BASED_OUTPATIENT_CLINIC_OR_DEPARTMENT_OTHER): Payer: BC Managed Care – PPO

## 2021-04-17 DIAGNOSIS — O43192 Other malformation of placenta, second trimester: Secondary | ICD-10-CM | POA: Insufficient documentation

## 2021-04-17 DIAGNOSIS — O285 Abnormal chromosomal and genetic finding on antenatal screening of mother: Secondary | ICD-10-CM | POA: Diagnosis not present

## 2021-04-17 DIAGNOSIS — Z3A26 26 weeks gestation of pregnancy: Secondary | ICD-10-CM

## 2021-04-17 DIAGNOSIS — O365920 Maternal care for other known or suspected poor fetal growth, second trimester, not applicable or unspecified: Secondary | ICD-10-CM

## 2021-04-17 DIAGNOSIS — O43199 Other malformation of placenta, unspecified trimester: Secondary | ICD-10-CM

## 2021-04-17 NOTE — Telephone Encounter (Signed)
LVM for Mia Williams informing her that I was calling her to discuss results from her noninvasive prenatal screening (NIPS). Requested a call back to my direct line to discuss these in more detail.  Gershon Crane, MS, [facility] Genetic Counselor

## 2021-04-17 NOTE — Progress Notes (Signed)
Referring provider:  [facility], [place] Length of consultation:  60 minutes  Mia Williams was seen for genetic counseling at [facility] at [facility], as results from noninvasive prenatal screening (NIPS) came back positive for an increased risk of trisomy 61, or Down syndrome, in the current pregnancy. We reviewed these results in detail along with testing options for the current pregnancy.  Her partner, Arlys John, was present at this visit.  The couple was seen in our [place] clinic and elected to have cell free fetal DNA testing performed on 04/10/21 after the findings of duodenal atresia, severe IUGR and echogenic bowel were noted on ultrasound. A fetal echocardiogram was also performed at [facility] in [place] on 04/14/21 which was generally reassuring.  See that report for details.  To review, Down syndrome is one of the most common extra chromosome conditions, as approximately 1 in 800 babies are born with this condition. There are different types of Down syndrome, with each type determined by the arrangement of the chromosome 21 pair. Approximately 95% of cases are caused by an entire extra copy of chromosome 21 (trisomy 21), and 2-4% of cases are due to a chromosomal rearrangement (translocation) involving chromosome 21. We reviewed that Down syndrome most commonly occurs by chance due to an error in chromosomal division during the formation of egg and sperm cells in a process called nondisjunction. When a couple has a child with the Trisomy type, there is approximately a 1% chance for recurrence.  The presence of the translocation type of Down syndrome may increase the chances further depending upon which parent, if either, is a carrier of a chromosome rearrangement.   Down syndrome is characterized by a distinctive facial appearance, mild to moderate intellectual disability, and an increased chance for structural birth defects including duodenal atresia and heart defects. Approximately half of  babies with Down syndrome are born with a heart defect that may require surgery after birth. While many children with Down syndrome look similar to each other, each child with Down syndrome is unique and will have many more features in common with his or her own family members. Children with Down syndrome also have an increased chance for thyroid problems, which can range from an underactive to an overactive thyroid. Additionally, low muscle tone, gastrointestinal abnormalities, vision problems, and respiratory and ear infections are more common among babies with Down syndrome. We discussed that there are many more features that can be associated with Down syndrome; however, it is not possible to accurately predict all features that would be present in an individual with Down syndrome prenatally. Additionally, there is a high degree of variability seen among children who have this condition, meaning that every child with Down syndrome will not be affected in exactly the same way, and some children will have more or less features than others. It is not possible to predict what strengths and weaknesses a child with Down syndrome will have, just like it is not possible to predict this for any child. With the advances in medical technology, early intervention, and supportive therapies, many individuals with Down syndrome are able to live with an increasing degree of independence. Today, many adults with Down syndrome care for themselves, have jobs, and often live in group homes or apartments where assistance is available if needed.   We reviewed that NIPS analyzes cell free DNA originating from the placenta that is found in the maternal blood circulation during pregnancy. This test can provide information regarding the presence or absence of  extra fetal DNA for chromosomes 13, 18 and 21 as well as the sex chromosomes. The reported detection rate with testing at [facility] is greater than 99% for trisomy 82, trisomy 35,  trisomy 55, and monosomy X. However, it is not be considered diagnostic for chromosome conditions. Positive predictive value (PPV) is the probability that a pregnancy with a positive test result is truly affected. The PPV calculator offered by the [employer] and the [employer] takes into consideration both the maternal age as well as the lab results, and estimated the PPV for trisomy 21 in the current pregnancy to be 71%. Thus, there is a 29% chance that this could be a false positive result. This laboratory reports a 66% PPV for this patient's results. Given the ultrasound findings seen previously, we would estimate that this probability may be further increased.   Ms. Broshears  was counseled that there are several possible explanations for her high risk NIPS result. Firstly, the fetus could truly be affected by Down syndrome. Secondly, the fetus could be mosaic for Down syndrome, though this is rare. Mosaicism occurs when an individual has two or more genetically different sets of cells in their body. Individuals who are mosaic for Down syndrome have some cells in the body with trisomy 21 and may have other cells that are chromosomally normal. We discussed that it would not be possible to tell which features an individual with mosaic Down syndrome may have, as it is impossible to assess which specific cells and tissues in the body have Down syndrome. Individuals who are mosaic for Down syndrome may be more mildly affected than individuals with full trisomy 21, though this is not always the case. Thirdly, the placenta could have Down syndrome while the fetus could be unaffected. This is a phenomenon known as confined placental mosaicism (CPM). Lastly, this could be a false positive result that resulted due to normal variation.  Ms. Oh was also counseled regarding diagnostic testing via amniocentesis. Amniocentesis is an option beginning at 30 weeks' gestation. We  discussed the technical aspects of the procedure and quoted up to a 1 in 500 (0.2%) risk for spontaneous pregnancy loss or other adverse pregnancy outcomes as a result of amniocentesis. Cultured cells from an amniocentesis sample allow for the visualization of a fetal karyotype, which can detect >99% of chromosomal aberrations. Chromosomal microarray can also be performed to identify smaller deletions or duplications of fetal chromosomal material. Ms. Kuhnert was informed that diagnostic testing would be the only way to definitively determine if the fetus has Down syndrome prenatally.   Ms. Gascoigne was also made aware that she has the option of continuing with standard ultrasounds and pursuing genetic testing postnatally. This pregnancy will be carefully followed as recommended due to the known IUGR, duodenal atresia and echogenic bowel.  Plan of care:  The couple declined amniocentesis, as it would not change their management of the pregnancy.  Referral was placed to [facility] for transfer of care to MFM and Pediatric Surgery consult.  Follow up ultrasounds scheduled at [facility] as determined by Dr. Parke Poisson (see u/s report).  They were provided with my card, contact information and a list of resources for parents about Down syndrome.  The patient was encouraged to contact us at [phone] with any questions or concerns.  Cherly Anderson, MS, CGC

## 2021-04-21 ENCOUNTER — Other Ambulatory Visit: Payer: Self-pay | Admitting: Obstetrics and Gynecology

## 2021-04-21 ENCOUNTER — Other Ambulatory Visit: Payer: Self-pay | Admitting: Obstetrics

## 2021-04-21 DIAGNOSIS — O43199 Other malformation of placenta, unspecified trimester: Secondary | ICD-10-CM

## 2021-04-21 DIAGNOSIS — O359XX0 Maternal care for (suspected) fetal abnormality and damage, unspecified, not applicable or unspecified: Secondary | ICD-10-CM

## 2021-04-21 DIAGNOSIS — O365931 Maternal care for other known or suspected poor fetal growth, third trimester, fetus 1: Secondary | ICD-10-CM

## 2021-04-21 DIAGNOSIS — O365921 Maternal care for other known or suspected poor fetal growth, second trimester, fetus 1: Secondary | ICD-10-CM

## 2021-04-24 ENCOUNTER — Other Ambulatory Visit: Payer: BC Managed Care – PPO

## 2021-04-24 ENCOUNTER — Ambulatory Visit (HOSPITAL_BASED_OUTPATIENT_CLINIC_OR_DEPARTMENT_OTHER): Payer: BC Managed Care – PPO | Admitting: Obstetrics and Gynecology

## 2021-04-24 ENCOUNTER — Other Ambulatory Visit: Payer: Self-pay

## 2021-04-24 ENCOUNTER — Ambulatory Visit: Payer: BC Managed Care – PPO | Attending: Obstetrics and Gynecology

## 2021-04-24 DIAGNOSIS — O43192 Other malformation of placenta, second trimester: Secondary | ICD-10-CM | POA: Diagnosis not present

## 2021-04-24 DIAGNOSIS — O365921 Maternal care for other known or suspected poor fetal growth, second trimester, fetus 1: Secondary | ICD-10-CM | POA: Diagnosis not present

## 2021-04-24 DIAGNOSIS — O359XX0 Maternal care for (suspected) fetal abnormality and damage, unspecified, not applicable or unspecified: Secondary | ICD-10-CM

## 2021-04-24 DIAGNOSIS — O0992 Supervision of high risk pregnancy, unspecified, second trimester: Secondary | ICD-10-CM

## 2021-04-24 DIAGNOSIS — O365920 Maternal care for other known or suspected poor fetal growth, second trimester, not applicable or unspecified: Secondary | ICD-10-CM

## 2021-04-24 DIAGNOSIS — Z363 Encounter for antenatal screening for malformations: Secondary | ICD-10-CM | POA: Diagnosis not present

## 2021-04-24 DIAGNOSIS — O43199 Other malformation of placenta, unspecified trimester: Secondary | ICD-10-CM

## 2021-04-24 DIAGNOSIS — Z3A27 27 weeks gestation of pregnancy: Secondary | ICD-10-CM

## 2021-04-24 DIAGNOSIS — O365931 Maternal care for other known or suspected poor fetal growth, third trimester, fetus 1: Secondary | ICD-10-CM

## 2021-04-24 DIAGNOSIS — Z3A25 25 weeks gestation of pregnancy: Secondary | ICD-10-CM

## 2021-04-24 NOTE — Progress Notes (Unsigned)
[facility] in [facility] Betamethosone x 2 on 5/20 and 5/21 @ 3:30 PM.  Pt to check in at admitting [facility] prior to going to [facility].  Glucose testing preformed on 5/19 @ [facility] ( Dr. Jerene Pitch Aware)  needs to be looked at before giving [facility] tomorrow.

## 2021-04-24 NOTE — Progress Notes (Signed)
Mia Williams 1986-08-07 [redacted]w[redacted]d  Fetus A Non-Stress Test Interpretation for 04/24/21  Indication: IUGR 27w 0d NST started @ 0834 FHR 136 10 x 10 Occasional Variables Stopped @ 0907 Reactive per Dr. Judeth Cornfield

## 2021-04-25 ENCOUNTER — Observation Stay
Admission: RE | Admit: 2021-04-25 | Discharge: 2021-04-25 | Disposition: A | Discharge status: Home / Self Care | Payer: BC Managed Care – PPO | Attending: Obstetrics and Gynecology | Admitting: Obstetrics and Gynecology

## 2021-04-25 DIAGNOSIS — O132 Gestational [pregnancy-induced] hypertension without significant proteinuria, second trimester: Principal | ICD-10-CM | POA: Insufficient documentation

## 2021-04-25 DIAGNOSIS — Z298 Encounter for other specified prophylactic measures: Secondary | ICD-10-CM | POA: Diagnosis not present

## 2021-04-25 DIAGNOSIS — O162 Unspecified maternal hypertension, second trimester: Secondary | ICD-10-CM

## 2021-04-25 DIAGNOSIS — Z3A27 27 weeks gestation of pregnancy: Secondary | ICD-10-CM

## 2021-04-25 LAB — COMPREHENSIVE METABOLIC PANEL
ALT: 13 U/L (ref 0–44)
AST: 15 U/L (ref 15–41)
Albumin: 3.2 g/dL — ABNORMAL LOW (ref 3.5–5.0)
Alkaline Phosphatase: 92 U/L (ref 38–126)
Anion gap: 7 (ref 5–15)
BUN: 9 mg/dL (ref 6–20)
CO2: 23 mmol/L (ref 22–32)
Calcium: 9.2 mg/dL (ref 8.9–10.3)
Chloride: 106 mmol/L (ref 98–111)
Creatinine, Ser: 0.41 mg/dL — ABNORMAL LOW (ref 0.44–1.00)
GFR, Estimated: >60 mL/min (ref >60)
Glucose, Bld: 90 mg/dL (ref 70–99)
Potassium: 3.8 mmol/L (ref 3.5–5.1)
Sodium: 136 mmol/L (ref 135–145)
Total Bilirubin: 0.5 mg/dL (ref 0.3–1.2)
Total Protein: 6.8 g/dL (ref 6.5–8.1)

## 2021-04-25 LAB — 28 WEEK RH+PANEL
Basophils Absolute: 0 10*3/uL (ref 0.0–0.2)
Basos: 0 %
EOS (ABSOLUTE): 0.1 10*3/uL (ref 0.0–0.4)
Eos: 1 %
Gestational Diabetes Screen: 139 mg/dL (ref 65–139)
HIV Screen 4th Generation wRfx: NONREACTIVE
Hematocrit: 35.8 % (ref 34.0–46.6)
Hemoglobin: 11.8 g/dL (ref 11.1–15.9)
Immature Grans (Abs): 0 10*3/uL (ref 0.0–0.1)
Immature Granulocytes: 0 %
Lymphocytes Absolute: 1.6 10*3/uL (ref 0.7–3.1)
Lymphs: 14 %
MCH: 28.5 pg (ref 26.6–33.0)
MCHC: 33 g/dL (ref 31.5–35.7)
MCV: 87 fL (ref 79–97)
Monocytes Absolute: 0.4 10*3/uL (ref 0.1–0.9)
Monocytes: 4 %
Neutrophils Absolute: 9.3 10*3/uL — ABNORMAL HIGH (ref 1.4–7.0)
Neutrophils: 81 %
Platelets: 256 10*3/uL (ref 150–450)
RBC: 4.14 x10E6/uL (ref 3.77–5.28)
RDW: 13.1 % (ref 11.7–15.4)
RPR Ser Ql: NONREACTIVE
WBC: 11.5 10*3/uL — ABNORMAL HIGH (ref 3.4–10.8)

## 2021-04-25 LAB — CBC
HCT: 32.4 % — ABNORMAL LOW (ref 36.0–46.0)
Hemoglobin: 10.6 g/dL — ABNORMAL LOW (ref 12.0–15.0)
MCH: 28.5 pg (ref 26.0–34.0)
MCHC: 32.7 g/dL (ref 30.0–36.0)
MCV: 87.1 fL (ref 80.0–100.0)
Platelets: 253 10*3/uL (ref 150–400)
RBC: 3.72 MIL/uL — ABNORMAL LOW (ref 3.87–5.11)
RDW: 14 % (ref 11.5–15.5)
WBC: 12.7 10*3/uL — ABNORMAL HIGH (ref 4.0–10.5)
nRBC: 0 % (ref 0.0–0.2)

## 2021-04-25 LAB — PROTEIN / CREATININE RATIO, URINE
Creatinine, Urine: 46 mg/dL
Total Protein, Urine: <6 mg/dL

## 2021-04-25 MED ORDER — BETAMETHASONE SOD PHOS & ACET 6 (3-3) MG/ML IJ SUSP: 12.0000 mg | Freq: Once | INTRAMUSCULAR | Status: AC

## 2021-04-25 MED ORDER — BETAMETHASONE SOD PHOS & ACET 6 (3-3) MG/ML IJ SUSP
INTRAMUSCULAR | Status: AC
  Administered 2021-04-25: 12 mg via INTRAMUSCULAR
  Filled 2021-04-25: qty 5

## 2021-04-25 NOTE — Progress Notes (Signed)
Patient is P2G0, [redacted]w[redacted]d that presents for first dose of BMZ. Patient states no bleeding. No LOF. +FM. Patient BP slightly elevated, 144/93, provider made aware. Other vitals signs are WDL. Monitors applied and assessing.

## 2021-04-25 NOTE — Progress Notes (Signed)
Reviewed discharge instructions. Patient had no concerns or questions and verbalized understanding. Patient discharged home, patient was ambulatory and in good condition.

## 2021-04-25 NOTE — Discharge Summary (Addendum)
Physician Final Progress Note  Patient ID: CATY TESSLER MRN: [id number] DOB/AGE: 03-17-1986 35 y.o.  Admit date: 04/25/2021 Admitting provider: Tresea Mall, CNM Discharge date: 04/25/2021   Admission Diagnoses: 1st betamethasone dose, elevated blood pressure  Discharge Diagnoses:  Active Problems:   Labor and delivery, indication for care   History of Present Illness: The patient is a 35 y.o. female G2P0010 at [redacted]w[redacted]d who presents for her first dose of betamethasone. Her blood pressure is noted to be mildly elevated on admission to observation. She denies headache, visual changes, epigastric pain. She reports good fetal movement. She was placed on the monitors, labs collected, betamethasone administered. Her labs were within normal limits, blood pressure normalized. She is discharged to home with instructions and precautions and will return in 24 hours for her second dose of betamethasone. She has transferred her care to [facility] and will plan to have her ongoing prenatal care and delivery with them.   Past Medical History:  Diagnosis Date  . Family history of ovarian cancer    1/22 cancer genetic testing letter sent  . History of normal Holter exam    11 YEARS AGO. TOLD HEART LAYS CLOSER TO STERNUM THAN USUAL SO FEELS HEART BEAT    Past Surgical History:  Procedure Laterality Date  . DILATION AND EVACUATION N/A 07/20/2019   Procedure: DILATATION AND EVACUATION;  Surgeon: Natale Milch, MD;  Location: [facility] ORS;  Service: Gynecology;  Laterality: N/A;  . FOREIGN BODY REMOVAL     FEET  . TONSILLECTOMY      No current facility-administered medications on file prior to encounter.   Current Outpatient Medications on File Prior to Encounter  Medication Sig Dispense Refill  . fexofenadine-pseudoephedrine (ALLEGRA-D 24) 180-240 MG 24 hr tablet Take 1 tablet by mouth daily.    . Prenatal MV & Min w/FA-DHA (PRENATAL GUMMIES PO) Take by mouth.    . sertraline (ZOLOFT) 50 MG tablet  Take 1 tablet (50 mg total) by mouth daily. 30 tablet 3    No Known Allergies  Social History   Socioeconomic History  . Marital status: Single    Spouse name: Not on file  . Number of children: Not on file  . Years of education: Not on file  . Highest education level: Not on file  Occupational History  . Not on file  Tobacco Use  . Smoking status: Never Smoker  . Smokeless tobacco: Never Used  Vaping Use  . Vaping Use: Never used  Substance and Sexual Activity  . Alcohol use: Yes    Comment: 2X WEEK  . Drug use: Never  . Sexual activity: Not Currently  Other Topics Concern  . Not on file  Social History Narrative  . Not on file   Social Determinants of Health   Financial Resource Strain: Not on file  Food Insecurity: Not on file  Transportation Needs: Not on file  Physical Activity: Not on file  Stress: Not on file  Social Connections: Not on file  Intimate Partner Violence: Not on file    Family History  Problem Relation Age of Onset  . Ovarian cancer Paternal Grandmother   . Pancreatic cancer Paternal Grandfather      Review of Systems  Constitutional: Negative for chills and fever.  HENT: Negative for congestion, ear discharge, ear pain, hearing loss, sinus pain and sore throat.   Eyes: Negative for blurred vision and double vision.  Respiratory: Negative for cough, shortness of breath and wheezing.   Cardiovascular: Negative  for chest pain, palpitations and leg swelling.  Gastrointestinal: Negative for abdominal pain, blood in stool, constipation, diarrhea, heartburn, melena, nausea and vomiting.  Genitourinary: Negative for dysuria, flank pain, frequency, hematuria and urgency.  Musculoskeletal: Negative for back pain, joint pain and myalgias.  Skin: Negative for itching and rash.  Neurological: Negative for dizziness, tingling, tremors, sensory change, speech change, focal weakness, seizures, loss of consciousness, weakness and headaches.   Endo/Heme/Allergies: Negative for environmental allergies. Does not bruise/bleed easily.  Psychiatric/Behavioral: Negative for depression, hallucinations, memory loss, substance abuse and suicidal ideas. The patient is not nervous/anxious and does not have insomnia.      Physical Exam: BP 133/83   Pulse 78   Temp 97.9 F (36.6 C) (Oral)   Resp 16   Ht 5\' 3"  (1.6 m)   Wt 78.5 kg   LMP 10/17/2020   BMI 30.65 kg/m   Constitutional: Well nourished, well developed female in no acute distress.  HEENT: normal Skin: Warm and dry.  Cardiovascular: Regular rate and rhythm.   Respiratory: Clear to auscultation bilateral. Normal respiratory effort Abdomen: FHT present Back: no CVAT Neuro: DTRs 2+, Cranial nerves grossly intact Psych: Alert and Oriented x3. No memory deficits. Normal mood and affect.   Patient Vitals for the past 24 hrs:  BP Temp Temp src Pulse Resp Height Weight  04/25/21 1725 133/83 -- -- 78 -- -- --  04/25/21 1711 129/82 -- -- 74 -- -- --  04/25/21 1656 124/83 -- -- 79 -- -- --  04/25/21 1641 130/81 -- -- 79 -- -- --  04/25/21 1625 (!) 140/95 -- -- 86 -- -- --  04/25/21 1603 (!) 145/83 -- -- 84 16 -- --  04/25/21 1547 (!) 144/93 -- -- 99 16 -- --  04/25/21 1545 -- 97.9 F (36.6 C) Oral -- -- 5\' 3"  (1.6 m) 78.5 kg    Toco: negative Fetal well being: 130 bpm, moderate variability, +accelerations appropriate for gestational age, -decelerations   Consults: None  Significant Findings/ Diagnostic Studies: labs:  Results for FEBE, CHAMPA (MRN ) as of 04/25/2021 17:50  Ref. Range 04/25/2021 16:08 04/25/2021 16:30  COMPREHENSIVE METABOLIC PANEL Unknown  Rpt (A)  Sodium Latest Ref Range: 135 - 145 mmol/L  136  Potassium Latest Ref Range: 3.5 - 5.1 mmol/L  3.8  Chloride Latest Ref Range: 98 - 111 mmol/L  106  CO2 Latest Ref Range: 22 - 32 mmol/L  23  Glucose Latest Ref Range: 70 - 99 mg/dL  90  BUN Latest Ref Range: 6 - 20 mg/dL  9  Creatinine Latest Ref  Range: 0.44 - 1.00 mg/dL  04/27/2021 (L)  Calcium Latest Ref Range: 8.9 - 10.3 mg/dL  9.2  Anion gap Latest Ref Range: 5 - 15   7  Alkaline Phosphatase Latest Ref Range: 38 - 126 U/L  92  Albumin Latest Ref Range: 3.5 - 5.0 g/dL  3.2 (L)  AST Latest Ref Range: 15 - 41 U/L  15  ALT Latest Ref Range: 0 - 44 U/L  13  Total Protein Latest Ref Range: 6.5 - 8.1 g/dL  6.8  Total Bilirubin Latest Ref Range: 0.3 - 1.2 mg/dL  0.5  GFR, Estimated Latest Ref Range: >60 mL/min  >60  WBC Latest Ref Range: 4.0 - 10.5 K/uL  12.7 (H)  RBC Latest Ref Range: 3.87 - 5.11 MIL/uL  3.72 (L)  Hemoglobin Latest Ref Range: 12.0 - 15.0 g/dL  04/27/2021 (L)  HCT Latest Ref Range: 36.0 - 46.0 %  32.4 (L)  MCV Latest Ref Range: 80.0 - 100.0 fL  87.1  MCH Latest Ref Range: 26.0 - 34.0 pg  28.5  MCHC Latest Ref Range: 30.0 - 36.0 g/dL  03.4  RDW Latest Ref Range: 11.5 - 15.5 %  14.0  Platelets Latest Ref Range: 150 - 400 K/uL  253  nRBC Latest Ref Range: 0.0 - 0.2 %  0.0  Total Protein, Urine Latest Units: mg/dL <6   Protein Creatinine Ratio Latest Ref Range: 0.00 - 0.15 mg/mgCre Pend   Creatinine, Urine Latest Units: mg/dL 46     Procedures: NST  Hospital Course: The patient was admitted to Labor and Delivery Triage for observation.   Discharge Condition: good  Disposition: Discharge disposition: 01-Home or Self Care  Diet: Regular diet  Discharge Activity: Activity as tolerated  Discharge Instructions    Discharge activity:   Complete by: As directed    Return for 2nd dose of betamethasone in 24 hours   Discharge activity:  No Restrictions   Complete by: As directed    Discharge diet:  No restrictions   Complete by: As directed    No sexual activity restrictions   Complete by: As directed      Allergies as of 04/25/2021   No Known Allergies     Medication List    TAKE these medications   fexofenadine-pseudoephedrine 180-240 MG 24 hr tablet Commonly known as: ALLEGRA-D 24 Take 1 tablet by mouth  daily.   PRENATAL GUMMIES PO Take by mouth.   sertraline 50 MG tablet Commonly known as: Zoloft Take 1 tablet (50 mg total) by mouth daily.        Total time spent taking care of this patient: 25 minutes  Signed: Tresea Mall, CNM  04/25/2021, 5:39 PM

## 2021-04-26 ENCOUNTER — Other Ambulatory Visit: Payer: Self-pay | Admitting: Obstetrics and Gynecology

## 2021-04-26 ENCOUNTER — Inpatient Hospital Stay
Admission: RE | Admit: 2021-04-26 | Discharge: 2021-04-26 | Disposition: A | Discharge status: Home / Self Care | Payer: BC Managed Care – PPO | Attending: Obstetrics and Gynecology | Admitting: Obstetrics and Gynecology

## 2021-04-26 DIAGNOSIS — Z3A00 Weeks of gestation of pregnancy not specified: Secondary | ICD-10-CM | POA: Diagnosis not present

## 2021-04-26 DIAGNOSIS — F411 Generalized anxiety disorder: Secondary | ICD-10-CM

## 2021-04-26 DIAGNOSIS — O0992 Supervision of high risk pregnancy, unspecified, second trimester: Secondary | ICD-10-CM | POA: Diagnosis not present

## 2021-04-26 DIAGNOSIS — O09892 Supervision of other high risk pregnancies, second trimester: Secondary | ICD-10-CM

## 2021-04-26 MED ORDER — BETAMETHASONE SOD PHOS & ACET 6 (3-3) MG/ML IJ SUSP
INTRAMUSCULAR | Status: AC
  Administered 2021-04-26: 12 mg via INTRAMUSCULAR
  Filled 2021-04-26: qty 5

## 2021-04-26 MED ORDER — BETAMETHASONE SOD PHOS & ACET 6 (3-3) MG/ML IJ SUSP: 12.0000 mg | Freq: Once | INTRAMUSCULAR | Status: AC

## 2021-04-28 ENCOUNTER — Other Ambulatory Visit: Payer: BC Managed Care – PPO

## 2021-04-28 ENCOUNTER — Encounter: Payer: BC Managed Care – PPO | Admitting: Obstetrics and Gynecology

## 2021-04-29 ENCOUNTER — Other Ambulatory Visit: Payer: Self-pay

## 2021-04-29 ENCOUNTER — Ambulatory Visit: Payer: BC Managed Care – PPO

## 2021-04-29 ENCOUNTER — Ambulatory Visit: Payer: BC Managed Care – PPO | Attending: Maternal & Fetal Medicine

## 2021-04-29 ENCOUNTER — Other Ambulatory Visit: Payer: Self-pay | Admitting: Obstetrics and Gynecology

## 2021-04-29 DIAGNOSIS — O365930 Maternal care for other known or suspected poor fetal growth, third trimester, not applicable or unspecified: Secondary | ICD-10-CM

## 2021-04-29 DIAGNOSIS — O365931 Maternal care for other known or suspected poor fetal growth, third trimester, fetus 1: Secondary | ICD-10-CM

## 2021-04-29 DIAGNOSIS — O359XX0 Maternal care for (suspected) fetal abnormality and damage, unspecified, not applicable or unspecified: Secondary | ICD-10-CM

## 2021-04-29 DIAGNOSIS — O365920 Maternal care for other known or suspected poor fetal growth, second trimester, not applicable or unspecified: Secondary | ICD-10-CM | POA: Diagnosis not present

## 2021-04-29 DIAGNOSIS — Z3A27 27 weeks gestation of pregnancy: Secondary | ICD-10-CM | POA: Diagnosis not present

## 2021-04-29 DIAGNOSIS — O43199 Other malformation of placenta, unspecified trimester: Secondary | ICD-10-CM

## 2021-04-29 DIAGNOSIS — O43192 Other malformation of placenta, second trimester: Secondary | ICD-10-CM | POA: Diagnosis not present

## 2021-04-29 NOTE — Progress Notes (Unsigned)
Mia Williams [birth year] [redacted]w[redacted]d  Fetus A Non-Stress Test Interpretation for 04/29/21  Indication: {MFM NST INDICATIONS:20869}    NST ordered & started @ 0821. FHR 135.  Good Variability (2) 10x10's. No Decelerations. Activity interruptions noted on NST. Ended @ [id number]. Dr. Grace Bushy read NST.

## 2021-04-30 ENCOUNTER — Other Ambulatory Visit: Payer: Self-pay | Admitting: Obstetrics and Gynecology

## 2021-04-30 DIAGNOSIS — Q410 Congenital absence, atresia and stenosis of duodenum: Secondary | ICD-10-CM

## 2021-04-30 DIAGNOSIS — O365931 Maternal care for other known or suspected poor fetal growth, third trimester, fetus 1: Secondary | ICD-10-CM

## 2021-04-30 DIAGNOSIS — O43199 Other malformation of placenta, unspecified trimester: Secondary | ICD-10-CM

## 2021-04-30 DIAGNOSIS — O09293 Supervision of pregnancy with other poor reproductive or obstetric history, third trimester: Secondary | ICD-10-CM

## 2021-05-01 ENCOUNTER — Other Ambulatory Visit: Payer: BC Managed Care – PPO

## 2021-05-05 DIAGNOSIS — F419 Anxiety disorder, unspecified: Secondary | ICD-10-CM | POA: Insufficient documentation

## 2021-05-06 ENCOUNTER — Other Ambulatory Visit: Payer: Self-pay

## 2021-05-06 ENCOUNTER — Ambulatory Visit: Payer: BC Managed Care – PPO | Attending: Obstetrics and Gynecology

## 2021-05-06 ENCOUNTER — Other Ambulatory Visit: Payer: Self-pay | Admitting: Obstetrics and Gynecology

## 2021-05-06 ENCOUNTER — Ambulatory Visit: Payer: BC Managed Care – PPO

## 2021-05-06 DIAGNOSIS — Q410 Congenital absence, atresia and stenosis of duodenum: Secondary | ICD-10-CM

## 2021-05-06 DIAGNOSIS — O365930 Maternal care for other known or suspected poor fetal growth, third trimester, not applicable or unspecified: Secondary | ICD-10-CM | POA: Diagnosis not present

## 2021-05-06 DIAGNOSIS — O365931 Maternal care for other known or suspected poor fetal growth, third trimester, fetus 1: Secondary | ICD-10-CM

## 2021-05-06 DIAGNOSIS — O43193 Other malformation of placenta, third trimester: Secondary | ICD-10-CM

## 2021-05-06 DIAGNOSIS — O358XX0 Maternal care for other (suspected) fetal abnormality and damage, not applicable or unspecified: Secondary | ICD-10-CM | POA: Diagnosis not present

## 2021-05-06 DIAGNOSIS — O43199 Other malformation of placenta, unspecified trimester: Secondary | ICD-10-CM

## 2021-05-06 DIAGNOSIS — O351XX0 Maternal care for (suspected) chromosomal abnormality in fetus, not applicable or unspecified: Secondary | ICD-10-CM | POA: Diagnosis present

## 2021-05-06 DIAGNOSIS — Z3A28 28 weeks gestation of pregnancy: Secondary | ICD-10-CM | POA: Diagnosis not present

## 2021-05-06 NOTE — Progress Notes (Unsigned)
Mia Williams 01-17-1986 [redacted]w[redacted]d  Fetus A Non-Stress Test Interpretation for 05/06/21  Indication: {MFM NST INDICATIONS:20869}      NST Started @ 0813 Mode:External Baseline Rate: 135 Variability: 6-25 BPM Decels:None Reactive per Dr. Judeth Cornfield Completed @ [phone]

## 2021-05-08 ENCOUNTER — Other Ambulatory Visit: Payer: BC Managed Care – PPO

## 2021-05-08 ENCOUNTER — Other Ambulatory Visit: Payer: Self-pay

## 2021-05-08 ENCOUNTER — Ambulatory Visit: Payer: BC Managed Care – PPO | Attending: Maternal & Fetal Medicine

## 2021-05-08 DIAGNOSIS — O365990 Maternal care for other known or suspected poor fetal growth, unspecified trimester, not applicable or unspecified: Secondary | ICD-10-CM

## 2021-05-08 NOTE — Procedures (Signed)
JAMA MCMILLER 02-05-1986 [redacted]w[redacted]d  Fetus A Non-Stress Test Interpretation for 05/08/21  Indication: IUGR Started @ 0819 Completed @ 0855 Fetal Heart Rate A Mode: External Baseline Rate (A): 125 bpm Variability: Moderate Accelerations: 10 x 10 Decelerations: None Multiple birth?: No  Uterine Activity Mode: Toco  Interpretation (Fetal Testing) Nonstress Test Interpretation: Reactive (Per Dr. Grace Bushy)

## 2021-05-14 ENCOUNTER — Other Ambulatory Visit: Payer: Self-pay | Admitting: Obstetrics and Gynecology

## 2021-05-14 DIAGNOSIS — O43199 Other malformation of placenta, unspecified trimester: Secondary | ICD-10-CM

## 2021-05-14 DIAGNOSIS — Q410 Congenital absence, atresia and stenosis of duodenum: Secondary | ICD-10-CM

## 2021-05-14 DIAGNOSIS — O09293 Supervision of pregnancy with other poor reproductive or obstetric history, third trimester: Secondary | ICD-10-CM

## 2021-05-15 ENCOUNTER — Ambulatory Visit: Payer: BC Managed Care – PPO | Attending: Maternal & Fetal Medicine

## 2021-05-15 ENCOUNTER — Other Ambulatory Visit: Payer: Self-pay

## 2021-05-15 ENCOUNTER — Ambulatory Visit: Payer: BC Managed Care – PPO

## 2021-05-15 ENCOUNTER — Other Ambulatory Visit: Payer: BC Managed Care – PPO

## 2021-05-15 DIAGNOSIS — O358XX0 Maternal care for other (suspected) fetal abnormality and damage, not applicable or unspecified: Secondary | ICD-10-CM | POA: Diagnosis not present

## 2021-05-15 DIAGNOSIS — O351XX0 Maternal care for (suspected) chromosomal abnormality in fetus, not applicable or unspecified: Secondary | ICD-10-CM | POA: Diagnosis not present

## 2021-05-15 DIAGNOSIS — O09293 Supervision of pregnancy with other poor reproductive or obstetric history, third trimester: Secondary | ICD-10-CM | POA: Diagnosis not present

## 2021-05-15 DIAGNOSIS — Z3A30 30 weeks gestation of pregnancy: Secondary | ICD-10-CM | POA: Insufficient documentation

## 2021-05-15 DIAGNOSIS — Q410 Congenital absence, atresia and stenosis of duodenum: Secondary | ICD-10-CM

## 2021-05-15 DIAGNOSIS — O43199 Other malformation of placenta, unspecified trimester: Secondary | ICD-10-CM

## 2021-05-15 DIAGNOSIS — O365930 Maternal care for other known or suspected poor fetal growth, third trimester, not applicable or unspecified: Secondary | ICD-10-CM

## 2021-05-22 ENCOUNTER — Other Ambulatory Visit: Payer: BC Managed Care – PPO

## 2021-05-24 HISTORY — PX: OTHER SURGICAL HISTORY: SHX169

## 2021-05-27 DIAGNOSIS — Z98891 History of uterine scar from previous surgery: Secondary | ICD-10-CM | POA: Insufficient documentation

## 2022-02-26 ENCOUNTER — Telehealth: Payer: BC Managed Care – PPO | Admitting: Family

## 2022-02-26 DIAGNOSIS — J069 Acute upper respiratory infection, unspecified: Secondary | ICD-10-CM | POA: Diagnosis not present

## 2022-02-26 MED ORDER — BENZONATATE 100 MG PO CAPS: 100.0000 mg | ORAL_CAPSULE | Freq: Three times a day (TID) | ORAL | 0 refills | Status: DC | PRN

## 2022-02-26 MED ORDER — FLUTICASONE PROPIONATE 50 MCG/ACT NA SUSP: 2.0000 | Freq: Every day | NASAL | 6 refills | Status: DC

## 2022-02-26 NOTE — Progress Notes (Signed)

## 2022-04-05 ENCOUNTER — Telehealth: Payer: BC Managed Care – PPO | Admitting: Family

## 2022-04-05 DIAGNOSIS — J069 Acute upper respiratory infection, unspecified: Secondary | ICD-10-CM

## 2022-04-05 MED ORDER — FLUTICASONE PROPIONATE 50 MCG/ACT NA SUSP: 2.0000 | Freq: Every day | NASAL | 6 refills | Status: DC

## 2022-04-05 MED ORDER — BENZONATATE 100 MG PO CAPS: 100.0000 mg | ORAL_CAPSULE | Freq: Three times a day (TID) | ORAL | 0 refills | Status: DC | PRN

## 2022-04-05 MED ORDER — CETIRIZINE HCL 10 MG PO TABS: 10.0000 mg | ORAL_TABLET | Freq: Every day | ORAL | 11 refills | Status: DC

## 2022-04-05 NOTE — Progress Notes (Signed)
E-Visit for Upper Respiratory Infection  ° °We are sorry you are not feeling well.  Here is how we plan to help! ° °Based on what you have shared with me, it looks like you may have a viral upper respiratory infection.  Upper respiratory infections are caused by a large number of viruses; however, rhinovirus is the most common cause.  ° °Symptoms vary from person to person, with common symptoms including sore throat, cough, fatigue or lack of energy and feeling of general discomfort.  A low-grade fever of up to 100.4 may present, but is often uncommon.  Symptoms vary however, and are closely related to a person's age or underlying illnesses.  The most common symptoms associated with an upper respiratory infection are nasal discharge or congestion, cough, sneezing, headache and pressure in the ears and face.  These symptoms usually persist for about 3 to 10 days, but can last up to 2 weeks.  It is important to know that upper respiratory infections do not cause serious illness or complications in most cases.   ° °Upper respiratory infections can be transmitted from person to person, with the most common method of transmission being a person's hands.  The virus is able to live on the skin and can infect other persons for up to 2 hours after direct contact.  Also, these can be transmitted when someone coughs or sneezes; thus, it is important to cover the mouth to reduce this risk.  To keep the spread of the illness at bay, good hand hygiene is very important. ° °This is an infection that is most likely caused by a virus. There are no specific treatments other than to help you with the symptoms until the infection runs its course.  We are sorry you are not feeling well.  Here is how we plan to help! ° ° °For nasal congestion, you may use an oral decongestants such as Mucinex D or if you have glaucoma or high blood pressure use plain Mucinex.  Saline nasal spray or nasal drops can help and can safely be used as often as  needed for congestion.  For your congestion, I have prescribed Fluticasone nasal spray one spray in each nostril twice a day and zyrtec 10 mg daily.  ° °If you do not have a history of heart disease, hypertension, diabetes or thyroid disease, prostate/bladder issues or glaucoma, you may also use Sudafed to treat nasal congestion.  It is highly recommended that you consult with a pharmacist or your primary care physician to ensure this medication is safe for you to take.    ° °If you have a cough, you may use cough suppressants such as Delsym and Robitussin.  If you have glaucoma or high blood pressure, you can also use Coricidin HBP.   °For cough I have prescribed for you A prescription cough medication called Tessalon Perles 100 mg. You may take 1-2 capsules every 8 hours as needed for cough ° °If you have a sore or scratchy throat, use a saltwater gargle- ¼ to ½ teaspoon of salt dissolved in a 4-ounce to 8-ounce glass of warm water.  Gargle the solution for approximately 15-30 seconds and then spit.  It is important not to swallow the solution.  You can also use throat lozenges/cough drops and Chloraseptic spray to help with throat pain or discomfort.  Warm or cold liquids can also be helpful in relieving throat pain. ° °For headache, pain or general discomfort, you can use Ibuprofen or Tylenol as   directed.   °Some authorities believe that zinc sprays or the use of Echinacea may shorten the course of your symptoms. ° ° °HOME CARE °Only take medications as instructed by your medical team. °Be sure to drink plenty of fluids. Water is fine as well as fruit juices, sodas and electrolyte beverages. You may want to stay away from caffeine or alcohol. If you are nauseated, try taking small sips of liquids. How do you know if you are getting enough fluid? Your urine should be a pale yellow or almost colorless. °Get rest. °Taking a steamy shower or using a humidifier may help nasal congestion and ease sore throat pain. You  can place a towel over your head and breathe in the steam from hot water coming from a faucet. °Using a saline nasal spray works much the same way. °Cough drops, hard candies and sore throat lozenges may ease your cough. °Avoid close contacts especially the very young and the elderly °Cover your mouth if you cough or sneeze °Always remember to wash your hands.  ° °GET HELP RIGHT AWAY IF: °You develop worsening fever. °If your symptoms do not improve within 10 days °You develop yellow or green discharge from your nose over 3 days. °You have coughing fits °You develop a severe head ache or visual changes. °You develop shortness of breath, difficulty breathing or start having chest pain °Your symptoms persist after you have completed your treatment plan ° °MAKE SURE YOU  °Understand these instructions. °Will watch your condition. °Will get help right away if you are not doing well or get worse. ° °Thank you for choosing an e-visit. ° °Your e-visit answers were reviewed by a board certified advanced clinical practitioner to complete your personal care plan. Depending upon the condition, your plan could have included both over the counter or prescription medications. ° °Please review your pharmacy choice. Make sure the pharmacy is open so you can pick up prescription now. If there is a problem, you may contact your provider through MyChart messaging and have the prescription routed to another pharmacy.  Your safety is important to us. If you have drug allergies check your prescription carefully.  ° °For the next 24 hours you can use MyChart to ask questions about today's visit, request a non-urgent call back, or ask for a work or school excuse. °You will get an email in the next two days asking about your experience. I hope that your e-visit has been valuable and will speed your recovery. ° °Approximately 5 minutes was spent documenting and reviewing patient's chart.  ° ° ° °

## 2022-05-16 IMAGING — US US MFM UA DOPPLER RE-EVAL
1 series · 13 of 28 positions shown · non-contrast
Comparison: none

[Series 1: us mfm ua doppler re-eval · 13 of 75 slices shown]
[im 3/75]
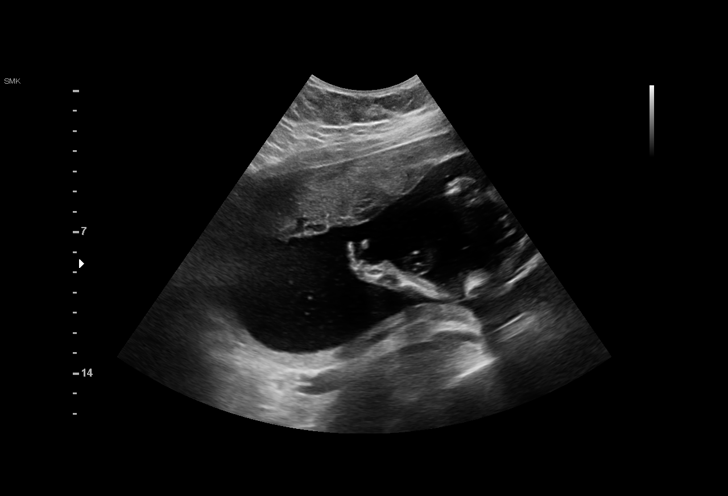
[im 9/75]
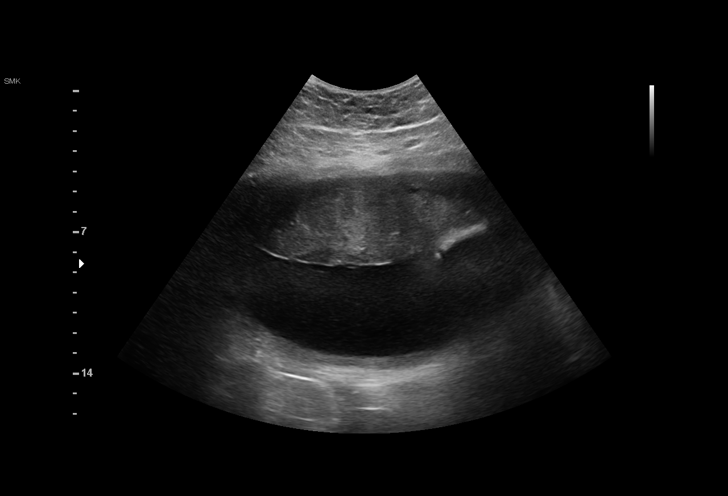
[im 14/75]
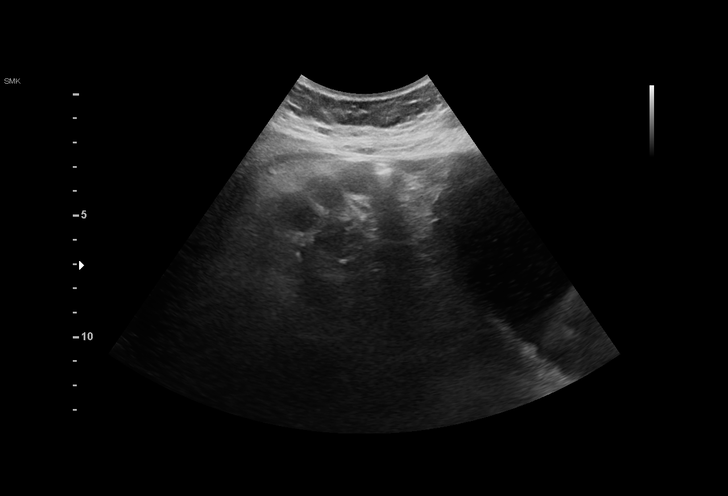
[im 20/75]
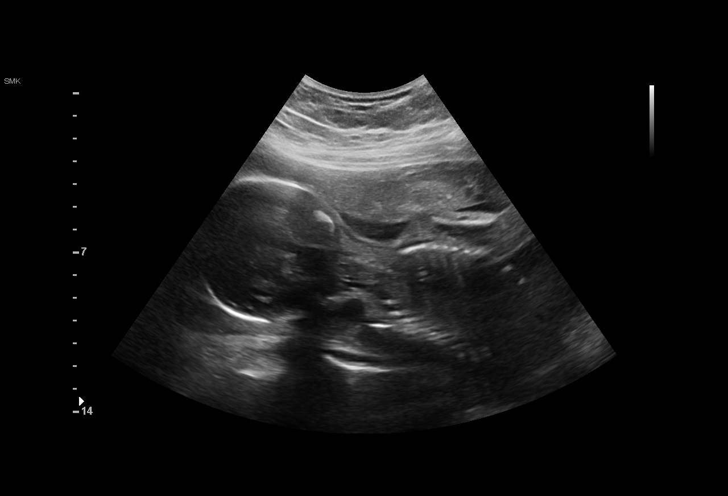
[im 25/75]
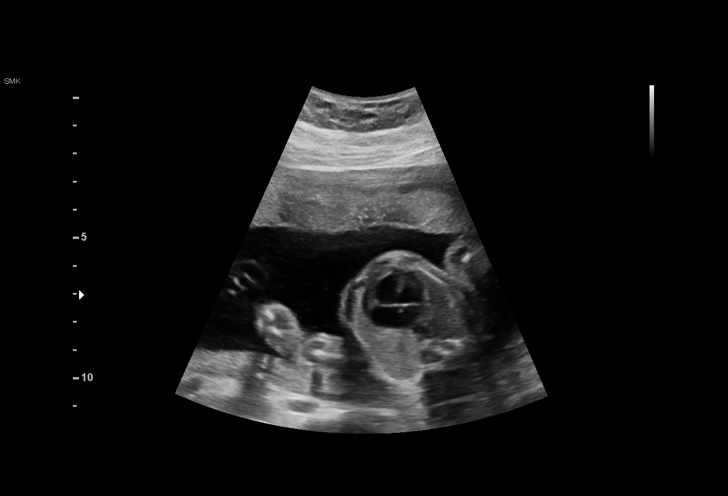
[im 31/75]
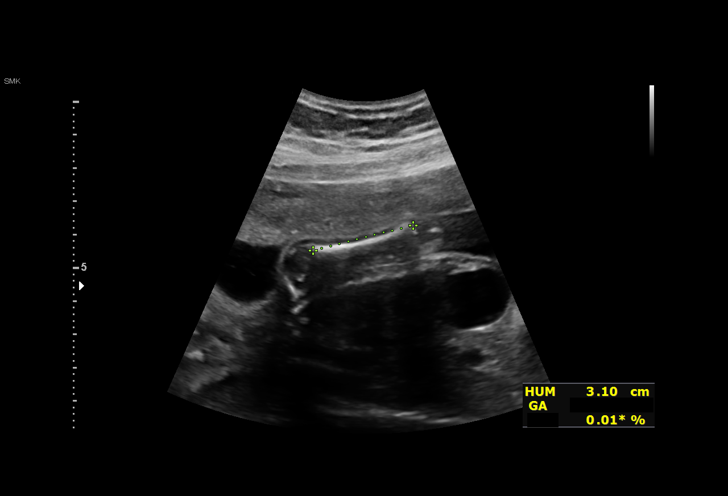
[im 39/75]
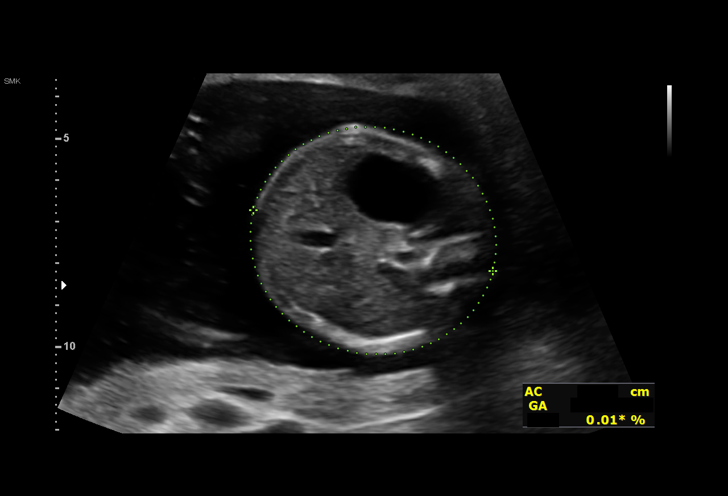
[im 44/75]
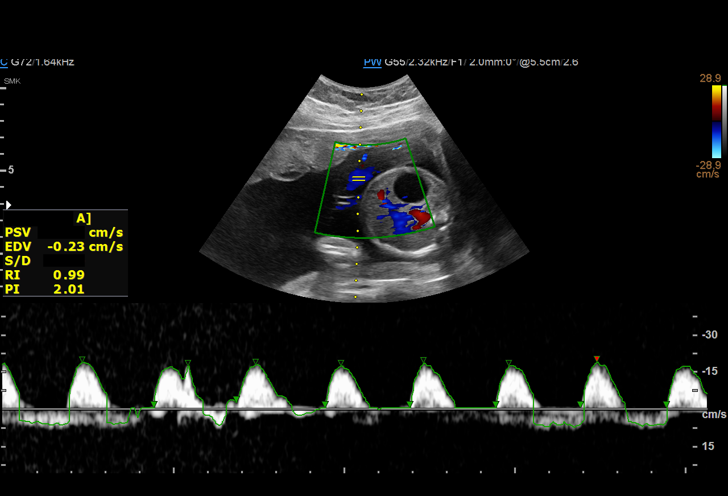
[im 50/75]
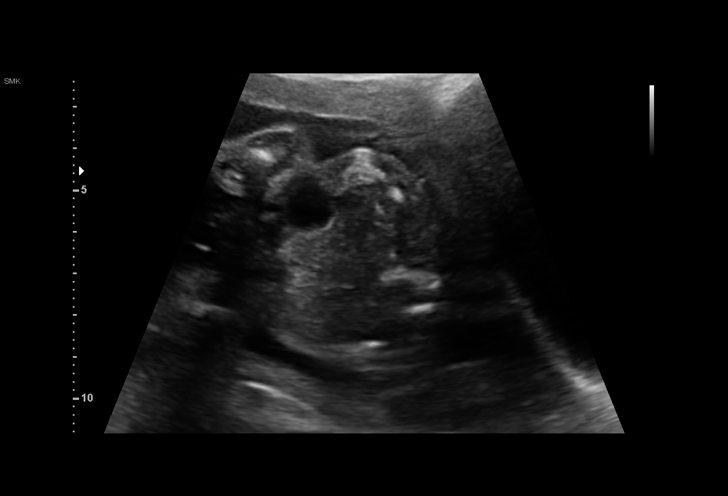
[im 55/75]
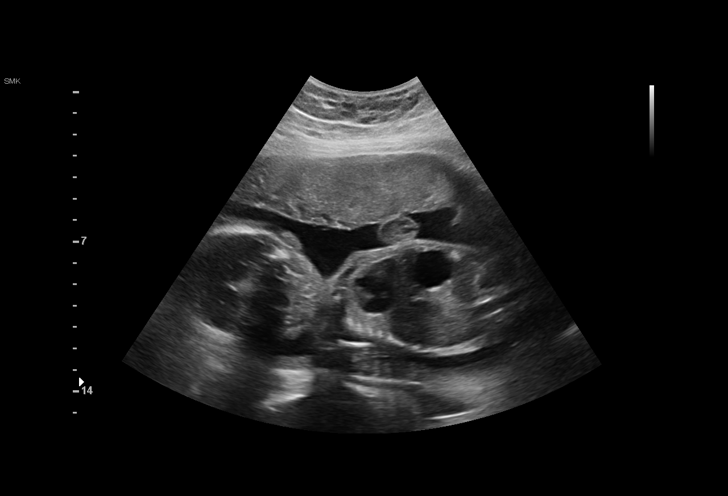
[im 61/75]
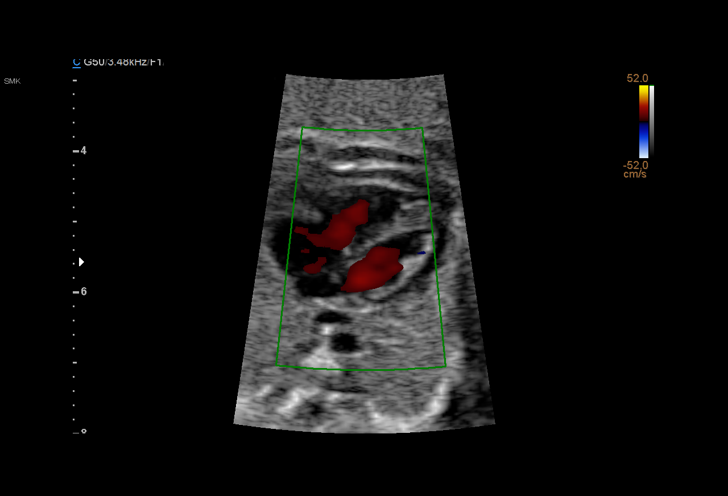
[im 66/75]
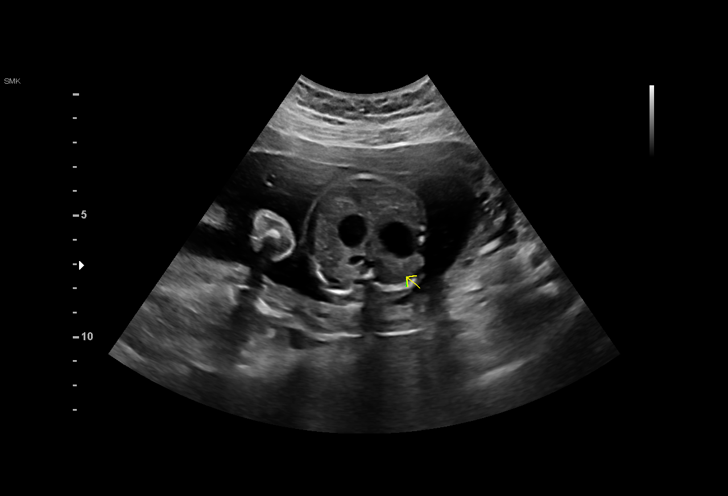
[im 72/75]
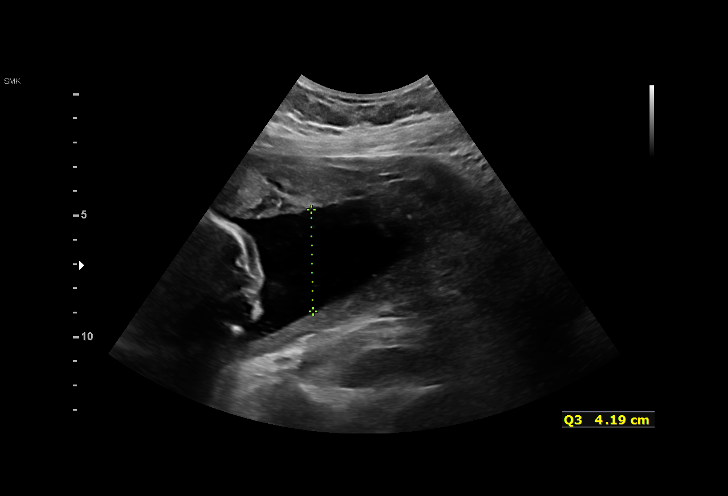

[13 of 28 positions shown; findings below may reference images not displayed]

Indications

 27 weeks gestation of pregnancy
 Maternal care for known or suspected poor
 fetal growth, second trimester, not applicable
 or unspecified IUGR
 Fetal abnormality - other known or suspected
 Antenatal screening for malformations
 Marginal insertion of umbilical cord affecting
 management of mother in second trimester
Fetal Evaluation

 Num Of Fetuses:         1
 Fetal Heart Rate(bpm):  125
 Cardiac Activity:       Observed
 Presentation:           Variable
 Placenta:               Anterior

 AFI Sum(cm)     %Tile       Largest Pocket(cm)
 19.1            75
Biometry

 BPD:      60.9  mm     G. Age:  24w 5d        < 1  %    CI:        78.64   %    70 - 86
                                                         FL/HC:      15.4   %    18.6 -
 HC:      217.2  mm     G. Age:  23w 5d        < 1  %    HC/AC:      1.21        1.05 -
 AC:      179.8  mm     G. Age:  22w 6d        < 1  %    FL/BPD:     54.8   %    71 - 87
 FL:       33.4  mm     G. Age:  20w 3d        < 1  %    FL/AC:      18.6   %    20 - 24
 HUM:      31.2  mm     G. Age:  20w 3d        < 5  %
 LV:        4.2  mm

 Est. FW:     475  gm      1 lb 1 oz    < 1  %
OB History

 Gravidity:    2         Term:   0        Prem:   0        SAB:   1
 TOP:          0       Ectopic:  0        Living: 0
Gestational Age

 LMP:           27w 0d        Date:  10/17/20                 EDD:   07/24/21
 U/S Today:     23w 0d                                        EDD:   08/21/21
 Best:          27w 0d     Det. By:  LMP  (10/17/20)          EDD:   07/24/21
Anatomy

 Ventricles:            Appears normal         Kidneys:                Appear normal
 Heart:                 Appears normal         Bladder:                Appears normal
                        (4CH, axis, and
                        situs)
 Diaphragm:             Appears normal         Spine:                  Not well visualized
 Stomach:               Double bubble
Doppler - Fetal Vessels

 Umbilical Artery
  S/D     %tile      RI    %tile                             ADFV
 20.71   > 97.5    0.95   > 97.5                               Yes

Cervix Uterus Adnexa

 Cervix
 Length:           3.47  cm.

 Right Ovary
 Not visualized.

 Left Ovary
 Size(cm)     3.09   x   1.74   x  2.41      Vol(ml):
 Within normal limits.
Impression

 Fetal duodenal atresia suspected on ultrasound. On cell-free
 fetal DNA screening, the risk for Down syndrome is
 increased. Patient had opted not to have amniocentesis.
 Severe fetal growth restriction with abnormal umbilical artery
 Doppler studies. Her pregnancy is well-dated by LMP date
 that is consistent with 9-week ultrasound dating.
 Blood pressure today at our office is 129/95 mm Hg.
 On today's ultrasound, the estimated fetal weight is at the 1st
 percentile. Poor interval growth is seen. Fetal weight gain
 over 3 weeks is 125 grams. Amniotic fluid is normal and good
 fetal activity is seen. No evidence of polyhydramnios. "Double-
 bubble" is seen.
 Umbilical artery Doppler showed intermittent absent-end-
 diastolic flow. NST is reactive for this gestational age.
 I counseled the couple on the findings.
 Severe fetal growth restriction
 -Most-likely cause is fetal chromosomal anomaly. Placental
 insufficiency from other causes can contribute to growth
 restriction.
 -Discussed the significance of abnormal Doppler studies and
 the recommendation to increase frequency of antenatal
 testing (twice weekly).
 -I counseled the patient on the benefit of antenatal
 corticosteroids for fetal pulmonary maturity. I recommended
 steroids now after screening for gestational diabetes.
 -Patient is aware of the limitations of antenatal testing in
 predicting fetal compromise, and that fetal demise can occur
 despite frequent antenatal testing.
 She has an appointment with MFM at [REDACTED[facility] Ctr and will
 be delivering there. We will comanage till delivery.
 Patient went over her doctor's office to have screening for
 GDM.
 She will be returning to L&D tomorrow for the first dose of
 Theodora.
Recommendations

 -Screen for GDM today.
 -Abushad first dose tomorrow at L[facility]D, [HOSPITAL].
                 Majbri

## 2022-05-21 IMAGING — US US MFM OB LIMITED
1 series · 14 of 28 positions shown · non-contrast
Comparison: none

[Series 1: us mfm ob limited · 36 acquisitions, 14 frames shown]
[im 2/36]
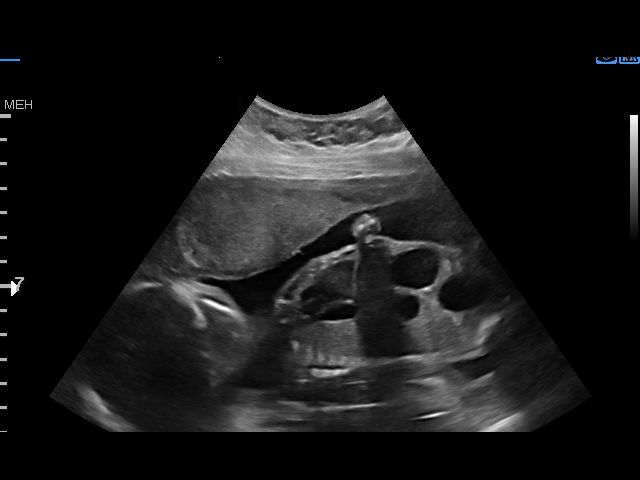
[im 4/36]
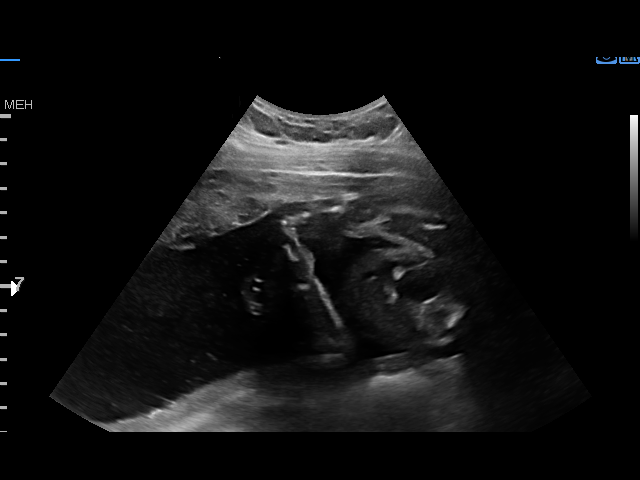
[im 7/36]
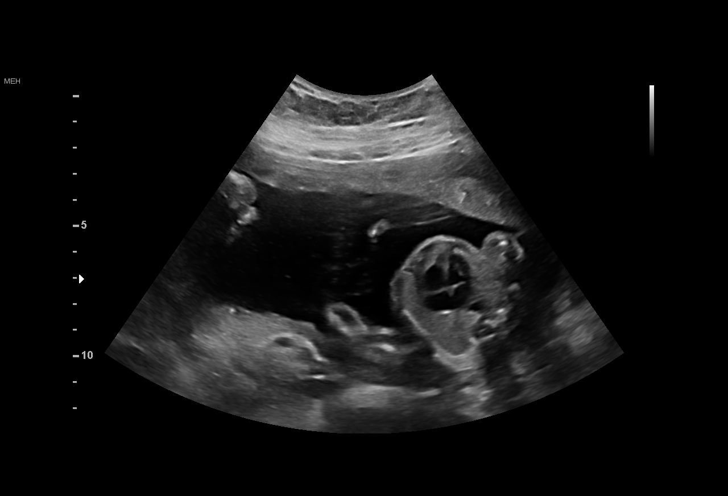
[im 10/36]
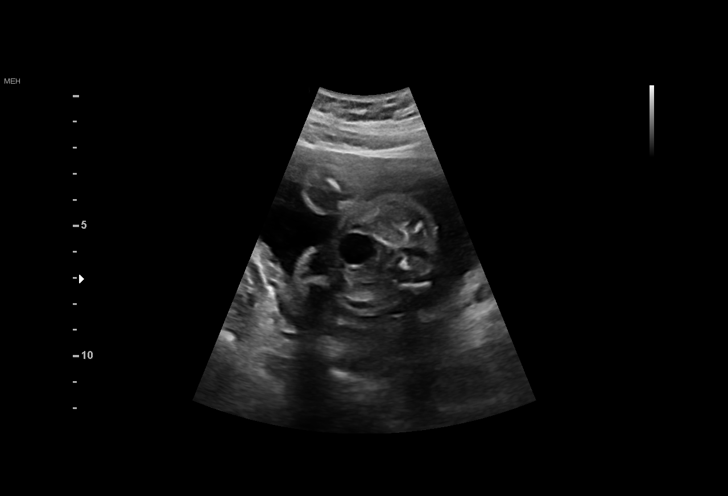
[im 12/36]
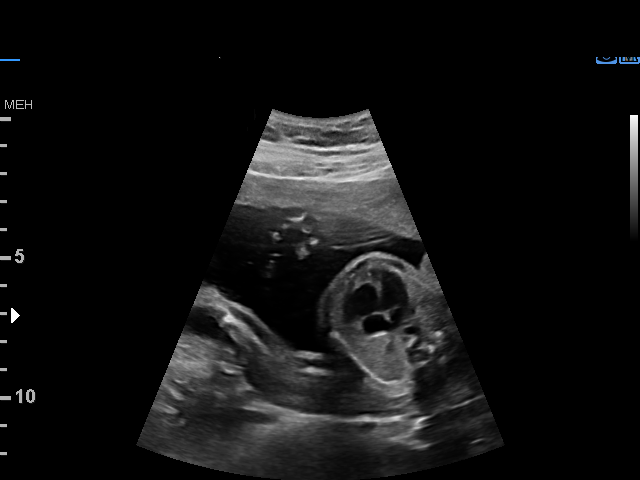
[im 15/36]
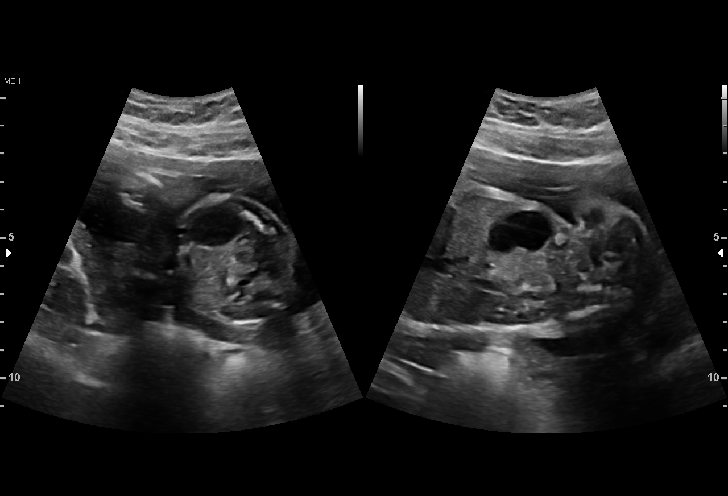
[im 17/36]
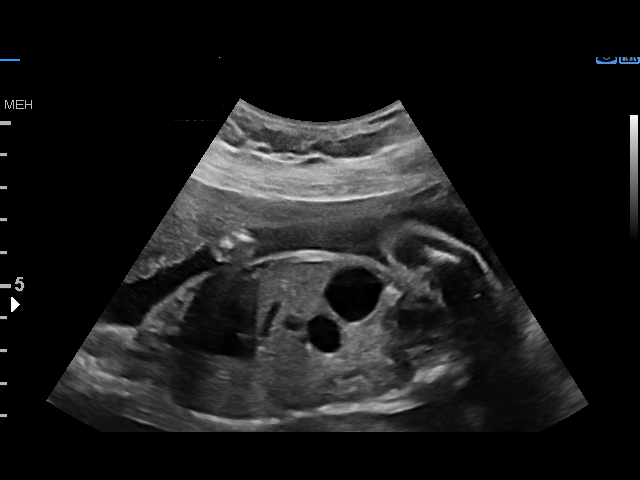
[im 20/36]
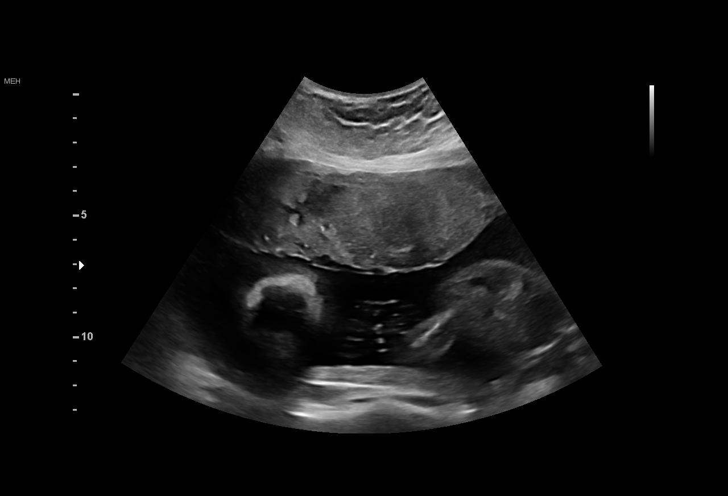
[im 23/36]
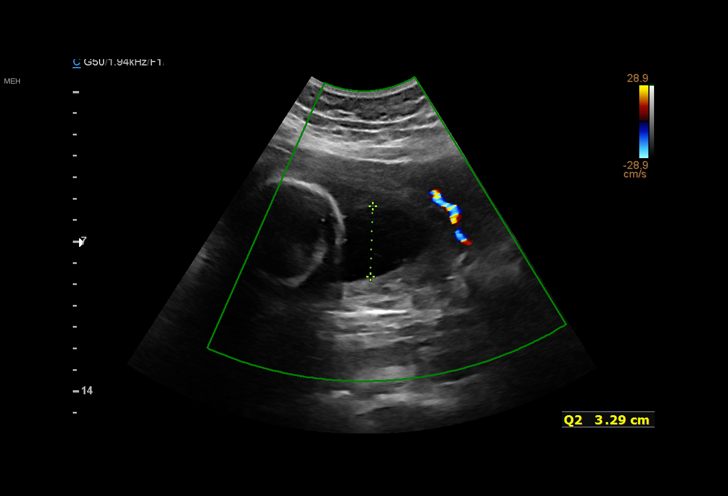
[im 25/36]
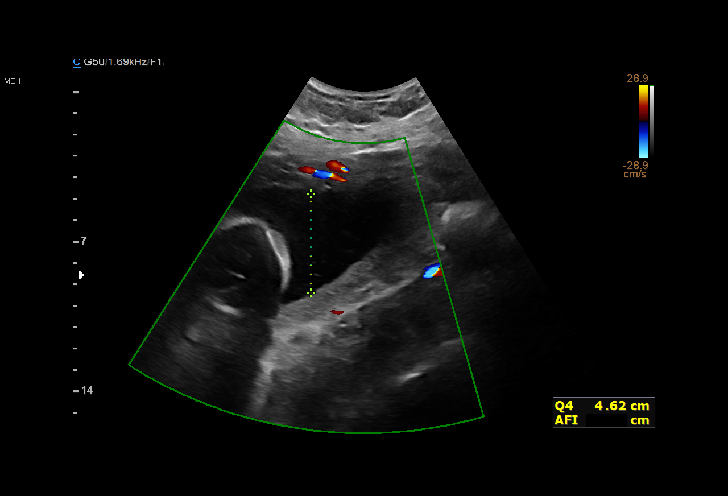
[im 28/36]
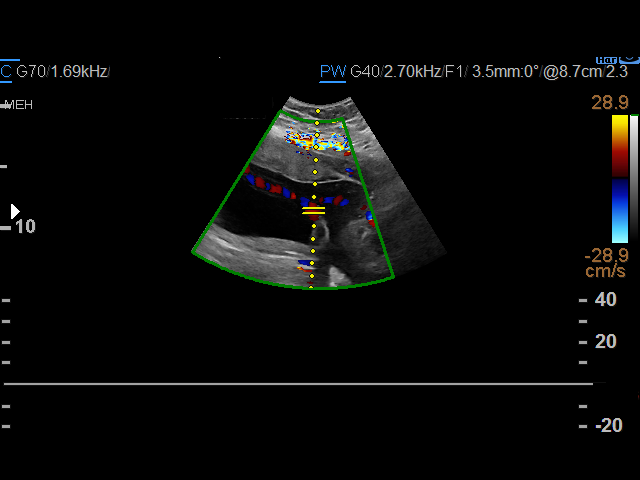
[im 30/36]
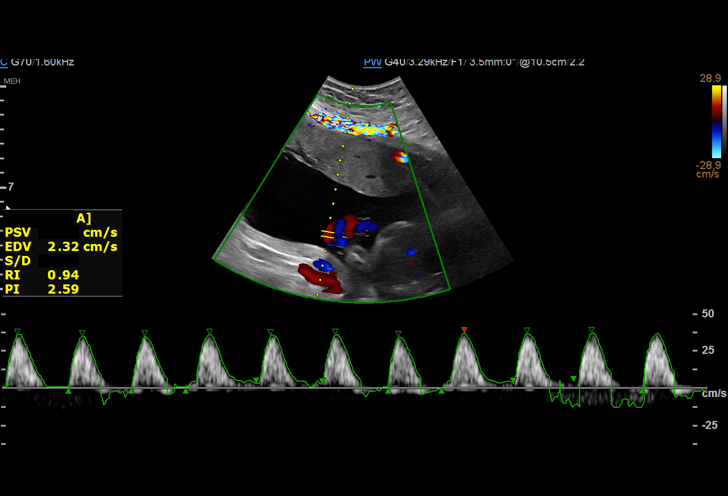
[im 33/36]
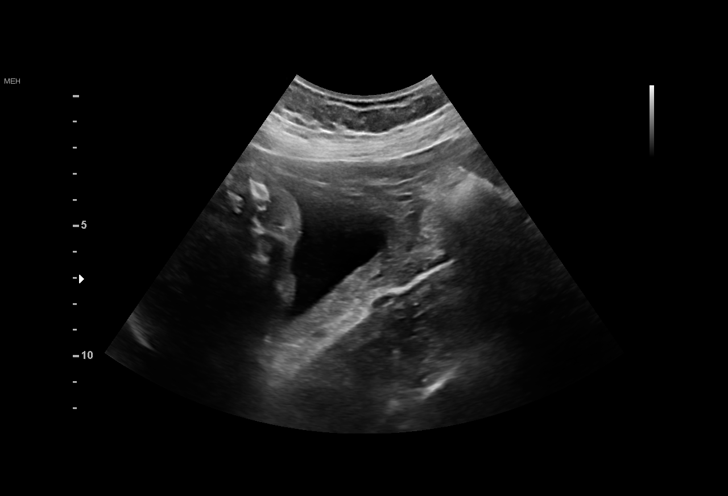
[im 36/36]
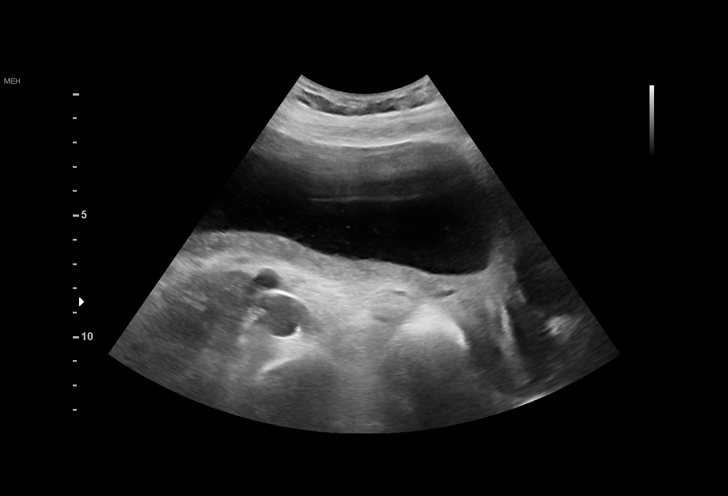

[14 of 28 positions shown; findings below may reference images not displayed]

1  US MFM UA CORD DOPPLER                76820.02    ANNES MUA
 3  US MFM OB LIMITED                     76815.01    ANNES MUA

Indications

 27 weeks gestation of pregnancy
 Maternal care for known or suspected poor
 fetal growth, second trimester, not applicable
 or unspecified IUGR
 Marginal insertion of umbilical cord affecting
 management of mother in second trimester
 Duodenal atresia
Fetal Evaluation

 Num Of Fetuses:         1
 Fetal Heart Rate(bpm):  141
 Cardiac Activity:       Observed
 Presentation:           Breech
 Placenta:               Anterior

 Amniotic Fluid
 AFI FV:      Within normal limits

 AFI Sum(cm)     %Tile
 18.7            73
Biophysical Evaluation

 Amniotic F.V:   Within normal limits       F. Tone:        Observed
 F. Movement:    Observed                   Score:          [DATE]
 F. Breathing:   Observed
OB History

 Gravidity:    2         Term:   0        Prem:   0        SAB:   1
 TOP:          0       Ectopic:  0        Living: 0
Gestational Age

 LMP:           27w 5d        Date:  10/17/20                 EDD:   07/24/21
 Best:          27w 5d     Det. By:  LMP  (10/17/20)          EDD:   07/24/21
Anatomy

 Cranium:               Previously seen        Aortic Arch:            Previously seen
 Cavum:                 Previously seen        Ductal Arch:            Previously seen
 Ventricles:            Previously seen        Diaphragm:              Appears normal
 Choroid Plexus:        Previously seen        Stomach:                Double bubble
 Cerebellum:            Previously seen        Abdomen:                Previously seen
 Posterior Fossa:       Previously seen        Abdominal Wall:         Previously seen
 Nuchal Fold:           Previously seen        Cord Vessels:           Previously seen
 Face:                  Profile nl; orbits not Kidneys:                Appear normal
                        well visualized
 Lips:                  Previously seen        Bladder:                Appears normal
 Thoracic:              Previously seen        Spine:                  Previously seen
 Heart:                 Appears normal         Upper Extremities:      Previously seen
                        (4CH, axis, and
                        situs)
 RVOT:                  Previously seen        Lower Extremities:      Previously seen
 LVOT:                  Previously seen
Doppler - Fetal Vessels

 Umbilical Artery
  S/D     %tile
  15.8   >

Comments

 Ms. Henriksen is here for antenatal testing with known in
 increased risk for T21 and "double bubble" sign with severe
 FGR and abnormal UA Dopplers.

 Today a biophysical profile was performed and it was [DATE].
 The NST was reactive without evidence of a deceleration.
 The UA Dopplers were again intermittent absent end diastolic
 flow. There was no evidence of REDF or persistent AEDF.

 There was excellent fetal movement and the amniotic fluid
 was normal.

 Ms. Henriksen has a normal 1hr GTT and has received a course
 of Jelks.

 I reviewed the consistent nature of today's exam as seen in
 the observed previously.  She will continue 2x weekly testing
 (NST/AFI or BPP with UA dopplers).

 She is scheduled for her pre delivery appts at [facility] this
 [REDACTED] and we assume similar testing will be performed
 there.

 She is scheduled to follow up here on [REDACTED] next week.
 All questions answered.

## 2022-06-25 ENCOUNTER — Ambulatory Visit: Payer: Self-pay

## 2022-06-25 ENCOUNTER — Other Ambulatory Visit: Payer: Self-pay

## 2022-06-25 ENCOUNTER — Ambulatory Visit: Payer: BC Managed Care – PPO | Attending: Obstetrics

## 2022-06-25 DIAGNOSIS — Z3A01 Less than 8 weeks gestation of pregnancy: Secondary | ICD-10-CM

## 2022-06-25 DIAGNOSIS — O352XX0 Maternal care for (suspected) hereditary disease in fetus, not applicable or unspecified: Secondary | ICD-10-CM

## 2022-06-25 DIAGNOSIS — Z8279 Family history of other congenital malformations, deformations and chromosomal abnormalities: Secondary | ICD-10-CM

## 2022-06-25 NOTE — Progress Notes (Addendum)
Referring practice:  [facility] Length of consultation: 30 minutes  Mia Mia Williams was referred to [facility] at [facility] for genetic counseling to review recurrence and testing options in this pregnancy due to the history of previous pregnancy with Down syndrome.  This note summarizes the information we discussed  Mia Mia Williams was followed closely by our clinic in her last pregnancy.  Their daughter, Mia Mia Williams, was born at [facility] in March of 2022 at [redacted] weeks gestation via c-section due to severe IUGR.  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  She passed away in 10-27-2023 following a long, complicated course of time in the NICU.  During that pregnancy, Mia Mia Williams and her husband Mia Mia Williams declined amniocentesis, as they were confident it would not change their decisions about the pregnancy.  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  I am in the process of getting the chromosome analysis results from [facility] to confirm these results and the recurrence chance more clearly.    We reviewed that chromosomes are the inherited structures that contain our instructions for development (genes). Each cell of our body normally has 46 chromosomes, matched up into 23 pairs. The last pair determines our gender and are called the sex chromosomes.  A female has an X and a Y chromosome, while a female has two X chromosomes. Rarely, when a mother's egg and Mia Williams's sperm unite, an extra or missing chromosome can be passed on to the baby by mistake. Changes in the number or the structure of the chromosomes may result in a child with some degree of mental retardation and physical problems.   Down syndrome is caused by having three copies (instead of the usual two copies) of the genes on chromosome number  21.  There are two types of Down syndrome.  Most often (about 95% of the time), Down syndrome is caused by an entire third copy of chromosome 21, known as Trisomy 49.  In rare cases, Down syndrome may be caused due to a translocation involving chromosome 21. We reviewed that although Down syndrome caused by an entire third copy of chromosome 21 is typically a sporadic event, there have been reports of couples who have had more than one pregnancy affected by a chromosome condition. The chance for a couple to have a subsequent pregnancy with aneuploidy is estimated to be 1% prior to the maternal age of 35 years old.  After that age, the risk is equivalent to the age related chance.  We discussed the following prenatal screening and testing options for this pregnancy:  Cell free fetal DNA testing from maternal blood to determine whether or not the baby may have either Down syndrome, trisomy 84, or trisomy 69.  This test utilizes a maternal blood sample and DNA sequencing technology to isolate circulating cell free fetal DNA from maternal plasma.  The fetal DNA can then be analyzed for DNA sequences that are derived from the three most common chromosomes involved in aneuploidy, chromosomes 13, 18, and 21.  The screening can also assess the chance for sex chromosome aneuploidies and fetal gender. If the overall amount of DNA is greater than the expected level for any of these chromosomes, aneuploidy is suspected.  While we do not consider it a replacement for invasive testing and karyotype analysis, a  negative result from this testing would be reassuring, though not a guarantee of a normal chromosome complement for the baby.  An abnormal result is certainly suggestive of an abnormal chromosome complement, though we would still recommend CVS or amniocentesis to confirm any findings from this testing.  The chorionic villus sampling procedure is available for first trimester chromosome analysis.  This involves the  withdrawal of a small amount of chorionic villi (tissue from the developing placenta).  Risk of pregnancy loss is estimated to be approximately 1 in 200 to 1 in 100 (0.5 to 1%).  There is approximately a 1% (1 in 100) chance that the CVS chromosome results will be unclear.  Chorionic villi cannot be tested for neural tube defects.     Targeted ultrasound uses high frequency sound waves to create an image of the developing fetus.  An ultrasound is often recommended as a routine means of evaluating the pregnancy.  It is also used to screen for fetal anatomy problems (for example, a heart defect) that might be suggestive of a chromosomal or other abnormality.   Amniocentesis involves the removal of a small amount of amniotic fluid from the sac surrounding the fetus with the use of a thin needle inserted through the maternal abdomen and uterus.  Ultrasound guidance is used throughout the procedure. Fetal cells from amniotic fluid are directly evaluated and > 99.5% of chromosome problems and > 98% of open neural tube defects can be detected. This procedure is generally performed after the 15th week of pregnancy.  The main risks to this procedure include complications leading to miscarriage in approximately 1 in 500 cases (0.5%).  We obtained a detailed family history and pregnancy history, as a family history was not fully obtained during the pregnancy with Mia Mia Williams.  Mia Mia Williams reported that this is their third pregnancy.  The first resulted in an early miscarriage, then they had the pregnancy with Mia Mia Williams and now she is [redacted] weeks pregnant.  She reported no complications thus far and no exposure to alcohol, tobacco or recreational drugs.  She is taking Zoloft as prescribed by her OB for anxiety.  In the family history, she reported Mia Mia Williams has a child with autism.  Autism Spectrum Disorder affects approximately 1-2% of the general population in the [place], [place], and [place]. Autism is a neurological and  developmental disorder that affects how people interact with others, communicate, learn, and behave. Autism is known as a "spectrum" disorder because there is wide variation in the type and severity of symptoms people experience. Genetic testing for individuals with a clinical diagnosis of autism yields an explanation in only about 20% of cases, and the remaining 80% of cases are left with unknown etiology. In the absence of a known genetic cause in the affected family member, we are unable to test directly for autism in pregnancy. We did review the option of carrier testing for Fragile X syndrome, the most common inherited cause for intellectual delays which can have autism as a features, though this seems unlikely given the fact that Mia Mia Williams and his Mia Williams are both developmentally appropriate.  Mia Mia Williams also had a melanoma in her teens and Mia Mia Williams has been diagnosed with sarcoidosis. Mia Mia Williams has dementia with onset in his 70s.  There are no additional family members with any of these conditions, which would make the chance for a strong genetic component reduced.  However, we cannot rule out possible inherited factors in these conditions.  The remainder of the family history is unremarkable  for birth defects, developmental delays, recurrent pregnancy loss of known genetic conditions.  As part of routine prenatal screening, we also offered carrier screening for cystic fibrosis, spinal muscular atrophy and hemoglobinopathies.  We discussed the recessive inheritance of these conditions, the common features and the fact that these are all included in newborn screening.  Annise was going to consider this testing option at her next visit.  Plan of care: Follow up on karyotype results on Mia Mia Williams from [facility] records. Records were received from [facility] with documentation of chromosome analysis showing 47,XX,+21 for Mirtha Jain (dob [birth year]). Therefore, recurrence chance for a chromosome condition is estimated to be 1%  (or the age related risk after the age of 34 years). Return to clinic in 4-5 weeks for viability and dating ultrasound. May speak with MFM regarding questions about recurrence for growth restriction as well as delivery due to prior classical c-section. We did stress that the chromosome condition in [place] is quite possibly the cause for the growth restriction. Ultrasound at [redacted] weeks gestation for first trimester anatomy.  Cell free DNA testing and carrier screening if desired can be performed at either of these ultrasound visits. CVS can also be arranged if desired. Anatomy ultrasound at [redacted] weeks gestation with option of amniocentesis if desired. MFM to make recommendations regarding following the pregnancy for possible growth restriction.   Ms. Sensabaugh was encouraged to call with questions or concerns.  We can be contacted at [phone].   Cherly Anderson, MS, CGC

## 2022-06-26 ENCOUNTER — Ambulatory Visit (INDEPENDENT_AMBULATORY_CARE_PROVIDER_SITE_OTHER): Payer: BC Managed Care – PPO

## 2022-06-26 VITALS — BP 120/76 | Ht 63.0 in | Wt 173.0 lb

## 2022-06-26 DIAGNOSIS — N912 Amenorrhea, unspecified: Secondary | ICD-10-CM

## 2022-06-26 LAB — POCT URINE PREGNANCY: Preg Test, Ur: POSITIVE — AB

## 2022-06-26 NOTE — Progress Notes (Signed)
Subjective:    Mia Williams is a 36 y.o. female who presents for evaluation of amenorrhea. She believes she could be pregnant. Pregnancy is desired.  Last period was normal.   Patient's last menstrual period was 05/11/2022 (exact date). The following portions of the patient's history were reviewed and updated as appropriate: None.  Lab Review Urine HCG: positive    Assessment:    Absence of menstruation.     Plan:    Pregnancy Test:  Positive: EDC: 02/15/2023. Briefly discussed positive results. Pt states her last baby died after five months in the NICU; was seen by MFM then (Prenatal care tx'd to MFM) and has contacted them now; MFM has her scheduled for a dating u/s on 07/23/22 and a second u/s on 08/12/22 for a more detailed look at the baby and for genetic testing; adv pt to go ahead and schedule NOB lab appt and NOB physical with [place] and if in the meantime MFM wants her to only see them she can cancel her appts with [place].  Sent to check out for scheduling for New OB appointments.

## 2022-07-21 ENCOUNTER — Other Ambulatory Visit: Payer: Self-pay

## 2022-07-21 DIAGNOSIS — Z8759 Personal history of other complications of pregnancy, childbirth and the puerperium: Secondary | ICD-10-CM

## 2022-07-21 DIAGNOSIS — O365990 Maternal care for other known or suspected poor fetal growth, unspecified trimester, not applicable or unspecified: Secondary | ICD-10-CM

## 2022-07-21 DIAGNOSIS — Z8279 Family history of other congenital malformations, deformations and chromosomal abnormalities: Secondary | ICD-10-CM

## 2022-07-22 ENCOUNTER — Other Ambulatory Visit: Payer: BC Managed Care – PPO

## 2022-07-22 ENCOUNTER — Other Ambulatory Visit: Payer: Self-pay

## 2022-07-22 DIAGNOSIS — Z3480 Encounter for supervision of other normal pregnancy, unspecified trimester: Secondary | ICD-10-CM

## 2022-07-22 DIAGNOSIS — Z369 Encounter for antenatal screening, unspecified: Secondary | ICD-10-CM

## 2022-07-23 ENCOUNTER — Other Ambulatory Visit: Payer: Self-pay | Admitting: Maternal & Fetal Medicine

## 2022-07-23 ENCOUNTER — Other Ambulatory Visit
Admission: RE | Admit: 2022-07-23 | Discharge: 2022-07-23 | Disposition: A | Discharge status: Home / Self Care | Payer: BC Managed Care – PPO | Source: Ambulatory Visit | Attending: Maternal & Fetal Medicine | Admitting: Maternal & Fetal Medicine

## 2022-07-23 ENCOUNTER — Ambulatory Visit (HOSPITAL_BASED_OUTPATIENT_CLINIC_OR_DEPARTMENT_OTHER): Payer: BC Managed Care – PPO

## 2022-07-23 ENCOUNTER — Other Ambulatory Visit: Payer: Self-pay

## 2022-07-23 DIAGNOSIS — Z3480 Encounter for supervision of other normal pregnancy, unspecified trimester: Secondary | ICD-10-CM | POA: Diagnosis present

## 2022-07-23 DIAGNOSIS — Z3A10 10 weeks gestation of pregnancy: Secondary | ICD-10-CM | POA: Diagnosis not present

## 2022-07-23 DIAGNOSIS — O09291 Supervision of pregnancy with other poor reproductive or obstetric history, first trimester: Secondary | ICD-10-CM | POA: Diagnosis not present

## 2022-07-23 DIAGNOSIS — Z8759 Personal history of other complications of pregnancy, childbirth and the puerperium: Secondary | ICD-10-CM

## 2022-07-23 DIAGNOSIS — O3510X0 Maternal care for (suspected) chromosomal abnormality in fetus, unspecified, not applicable or unspecified: Secondary | ICD-10-CM | POA: Diagnosis not present

## 2022-07-23 DIAGNOSIS — Z369 Encounter for antenatal screening, unspecified: Secondary | ICD-10-CM

## 2022-07-23 DIAGNOSIS — Z8279 Family history of other congenital malformations, deformations and chromosomal abnormalities: Secondary | ICD-10-CM

## 2022-07-23 LAB — CBC/D/PLT+RPR+RH+ABO+RUBIGG...
Antibody Screen: NEGATIVE
Basophils Absolute: 0 10*3/uL (ref 0.0–0.2)
Basos: 0 %
EOS (ABSOLUTE): 0.1 10*3/uL (ref 0.0–0.4)
Eos: 1 %
HCV Ab: NONREACTIVE
HIV Screen 4th Generation wRfx: NONREACTIVE
Hematocrit: 36.4 % (ref 34.0–46.6)
Hemoglobin: 12.6 g/dL (ref 11.1–15.9)
Hepatitis B Surface Ag: NEGATIVE
Immature Grans (Abs): 0 10*3/uL (ref 0.0–0.1)
Immature Granulocytes: 1 %
Lymphocytes Absolute: 1.7 10*3/uL (ref 0.7–3.1)
Lymphs: 20 %
MCH: 29.6 pg (ref 26.6–33.0)
MCHC: 34.6 g/dL (ref 31.5–35.7)
MCV: 86 fL (ref 79–97)
Monocytes Absolute: 0.4 10*3/uL (ref 0.1–0.9)
Monocytes: 4 %
Neutrophils Absolute: 6.3 10*3/uL (ref 1.4–7.0)
Neutrophils: 74 %
Platelets: 213 10*3/uL (ref 150–450)
RBC: 4.25 x10E6/uL (ref 3.77–5.28)
RDW: 13.8 % (ref 11.7–15.4)
RPR Ser Ql: NONREACTIVE
Rh Factor: POSITIVE
Rubella Antibodies, IGG: 1.23 index (ref >0.99)
Varicella zoster IgG: 2672 index (ref >165)
WBC: 8.5 10*3/uL (ref 3.4–10.8)

## 2022-07-23 LAB — HCV INTERPRETATION

## 2022-07-23 NOTE — Progress Notes (Unsigned)
Labs drawn today for MaterniT21 PLUS with [facility], patient declined carrier screening.  Cherly Anderson, MS, CGC

## 2022-07-24 ENCOUNTER — Encounter: Payer: Self-pay | Admitting: Obstetrics

## 2022-07-27 ENCOUNTER — Ambulatory Visit (INDEPENDENT_AMBULATORY_CARE_PROVIDER_SITE_OTHER): Payer: BC Managed Care – PPO

## 2022-07-27 DIAGNOSIS — Z3480 Encounter for supervision of other normal pregnancy, unspecified trimester: Secondary | ICD-10-CM | POA: Insufficient documentation

## 2022-07-27 DIAGNOSIS — Z3689 Encounter for other specified antenatal screening: Secondary | ICD-10-CM

## 2022-07-27 NOTE — Progress Notes (Signed)
New OB Intake  I connected with  Mia Williams on 07/27/22 at  3:15 PM EDT by telephone and verified that I am speaking with the correct person using two identifiers. Nurse is located at [facility] and pt is located in car.  I explained I am completing New OB Intake today. We discussed her EDD of 02/15/2023 that is based on LMP of exactly 05/11/22. Pt is G3/P0110. I reviewed her allergies, medications, Medical/Surgical/OB history, and appropriate screenings. Based on history, this is a/an pregnancy uncomplicated .   Patient Active Problem List   Diagnosis Date Noted   History of cesarean section 05/27/2021   Anxiety 05/05/2021   Labor and delivery, indication for care 04/25/2021    Concerns addressed today None.  Pt has already been seen by MFM.   Delivery Plans:  Plans to deliver are undecided right now; delivered at [facility] last preg.  Anatomy US Explained first scheduled [place] will be around 20 weeks. Anatomy US scheduled for 09/17/2022 at 4pm.   Labs Pt has already had prenatal labs and MaterniT-21 drawn.  COVID Vaccine Patient has had COVID vaccine.   Social Determinants of Health Food Insecurity: denies food insecurity Transportation: Patient denies transportation needs.  First visit review I reviewed new OB appt with pt. I explained she will have ob bloodwork and pap smear/pelvic exam if indicated. Explained pt will be seen by Paula Compton CNM at first visit; encounter routed to appropriate provider.   Loran Senters, [facility] 07/27/2022  3:50 PM  Clinical Staff Provider  Office Location  [facility] Dating    Language  English Anatomy US    Flu Vaccine  offer Genetic Screen  NIPS:   TDaP vaccine   offer Hgb A1C or  GTT Early : Third trimester :   Covid No boosters   LAB RESULTS   Rhogam   Blood Type A/Positive/-- (08/16 0911)   Feeding Plan breast Antibody Negative (08/16 0911)  Contraception Pill Rubella 1.23 (08/16 0911)  Circumcision yes RPR Non Reactive (08/16  0911)   Pediatrician  undecided HBsAg Negative (08/16 0911)   Support Person Judie Grieve HIV Non Reactive (08/16 0911)  Prenatal Classes undecided Varicella     GBS  (For PCN allergy, check sensitivities)   BTL Consent  Hep C   VBAC Consent  Pap      Hgb Electro      CF      SMA

## 2022-07-29 ENCOUNTER — Encounter: Payer: BC Managed Care – PPO | Admitting: Obstetrics

## 2022-07-30 ENCOUNTER — Telehealth: Payer: Self-pay | Admitting: Obstetrics and Gynecology

## 2022-07-30 NOTE — Telephone Encounter (Signed)
The patient was informed of the results of her recent MaterniT21 testing which yielded NEGATIVE results.  The patient's specimen showed DNA consistent with two copies of chromosomes 21, 18 and 13.  The sensitivity for trisomy 23, trisomy 86 and trisomy 74 using this testing are reported as 99.1%, 99.9% and 91.7% respectively.  Thus, while the results of this testing are highly accurate, they are not considered diagnostic at this time.  Should more definitive information be desired, the patient may still consider amniocentesis.   As requested to know by the patient, sex chromosome analysis was included for this sample.  Results are consistent with a female fetus. This is predicted with >99% accuracy.   This testing was ordered to include screening for sex chromosome conditions.  However, the sample was entered in the lab as an order without sex chromosome conditions.  I spoke with the lab and received a verbal report that the results are normal for sex chromosome aneuploidies and the director will add a comment to the existing report. This testing has greater than 96% accuracy for sex chromosome aneuploidies.   A maternal serum AFP only should be considered if screening for neural tube defects is desired.  We may be reached at [phone] with any questions or concerns.  Cherly Anderson, MS, CGC

## 2022-08-01 LAB — MATERNIT 21 PLUS CORE, BLOOD
Fetal Fraction: 5
Result (T21): NEGATIVE
Trisomy 13 (Patau syndrome): NEGATIVE
Trisomy 18 (Edwards syndrome): NEGATIVE
Trisomy 21 (Down syndrome): NEGATIVE

## 2022-08-05 ENCOUNTER — Ambulatory Visit (INDEPENDENT_AMBULATORY_CARE_PROVIDER_SITE_OTHER): Payer: BC Managed Care – PPO | Admitting: Obstetrics & Gynecology

## 2022-08-05 ENCOUNTER — Other Ambulatory Visit (HOSPITAL_COMMUNITY)
Admission: RE | Admit: 2022-08-05 | Discharge: 2022-08-05 | Disposition: A | Discharge status: Home / Self Care | Payer: BC Managed Care – PPO | Source: Ambulatory Visit | Attending: Obstetrics | Admitting: Obstetrics

## 2022-08-05 ENCOUNTER — Encounter: Payer: Self-pay | Admitting: Obstetrics & Gynecology

## 2022-08-05 VITALS — BP 128/80 | Wt 173.0 lb

## 2022-08-05 DIAGNOSIS — Z113 Encounter for screening for infections with a predominantly sexual mode of transmission: Secondary | ICD-10-CM | POA: Insufficient documentation

## 2022-08-05 DIAGNOSIS — Z98891 History of uterine scar from previous surgery: Secondary | ICD-10-CM

## 2022-08-05 DIAGNOSIS — Z3480 Encounter for supervision of other normal pregnancy, unspecified trimester: Secondary | ICD-10-CM

## 2022-08-05 LAB — POCT URINALYSIS DIPSTICK OB
Glucose, UA: NEGATIVE
POC,PROTEIN,UA: NEGATIVE

## 2022-08-05 NOTE — Progress Notes (Signed)
First visit-Poor OB outcome history   Mia Williams is a 36 y.o. female  G3P0110, being seen today for her obstetrical visit. She is at [redacted]w[redacted]d gestation. Patient reports no complaints. Fetal movement: normal.  Menstrual History: OB History     Gravida  3   Para  1   Term  0   Preterm  1   AB  1   Living  0      SAB  1   IAB      Ectopic      Multiple      Live Births  1           Menarche age: 75 Patient's last menstrual period was 05/11/2022 (exact date).    The following portions of the patient's history were reviewed and updated as appropriate: allergies, current medications, past family history, past medical history, past social history, past surgical history, and problem list.  Review of Systems A comprehensive review of systems was negative.   Objective:     BP 128/80   Wt 173 lb (78.5 kg)   LMP 05/11/2022 (Exact Date)   BMI 30.65 kg/m  Uterine Size: After 20 wks  Pelvic Exam:          Perineum: is normal                Vulva: normal              Vagina:  normal mucosa                     pH:               Cervix: Long & closed          Wet Prep:               Uterus: normal size 10 - 12 wks             Adnexa: normal adnexa     Bony Pelvis:      Assessment:    Pregnancy 12 and 2/7 weeks   Plan:    Problem list reviewed and updated. Labs reviewed. AFP3 discussed: undecided. Role of ultrasound in pregnancy discussed; fetal survey:  discussed . Amniocentesis discussed:  Follow up in 2 weeks.  Linzie Collin, MD  09/04/2022 1:23 PM

## 2022-08-06 ENCOUNTER — Other Ambulatory Visit: Payer: Self-pay

## 2022-08-06 DIAGNOSIS — Z8279 Family history of other congenital malformations, deformations and chromosomal abnormalities: Secondary | ICD-10-CM

## 2022-08-06 DIAGNOSIS — Z8759 Personal history of other complications of pregnancy, childbirth and the puerperium: Secondary | ICD-10-CM

## 2022-08-06 DIAGNOSIS — Z98891 History of uterine scar from previous surgery: Secondary | ICD-10-CM

## 2022-08-06 DIAGNOSIS — F419 Anxiety disorder, unspecified: Secondary | ICD-10-CM

## 2022-08-07 LAB — URINE CULTURE

## 2022-08-07 LAB — CERVICOVAGINAL ANCILLARY ONLY
Chlamydia: NEGATIVE
Comment: NEGATIVE
Comment: NORMAL
Neisseria Gonorrhea: NEGATIVE

## 2022-08-11 ENCOUNTER — Other Ambulatory Visit: Payer: Self-pay

## 2022-08-11 ENCOUNTER — Ambulatory Visit: Payer: BC Managed Care – PPO | Attending: Obstetrics and Gynecology

## 2022-08-11 DIAGNOSIS — O09291 Supervision of pregnancy with other poor reproductive or obstetric history, first trimester: Secondary | ICD-10-CM | POA: Diagnosis not present

## 2022-08-11 DIAGNOSIS — Z98891 History of uterine scar from previous surgery: Secondary | ICD-10-CM

## 2022-08-11 DIAGNOSIS — Z3A13 13 weeks gestation of pregnancy: Secondary | ICD-10-CM | POA: Diagnosis not present

## 2022-08-11 DIAGNOSIS — Q410 Congenital absence, atresia and stenosis of duodenum: Secondary | ICD-10-CM | POA: Diagnosis not present

## 2022-08-11 DIAGNOSIS — Z8279 Family history of other congenital malformations, deformations and chromosomal abnormalities: Secondary | ICD-10-CM

## 2022-08-11 DIAGNOSIS — Z363 Encounter for antenatal screening for malformations: Secondary | ICD-10-CM | POA: Diagnosis present

## 2022-08-11 DIAGNOSIS — O34219 Maternal care for unspecified type scar from previous cesarean delivery: Secondary | ICD-10-CM | POA: Diagnosis not present

## 2022-08-11 DIAGNOSIS — Z3682 Encounter for antenatal screening for nuchal translucency: Secondary | ICD-10-CM | POA: Diagnosis not present

## 2022-08-11 DIAGNOSIS — Z8759 Personal history of other complications of pregnancy, childbirth and the puerperium: Secondary | ICD-10-CM

## 2022-08-11 DIAGNOSIS — F419 Anxiety disorder, unspecified: Secondary | ICD-10-CM

## 2022-09-04 ENCOUNTER — Encounter: Payer: Self-pay | Admitting: Obstetrics & Gynecology

## 2022-09-15 ENCOUNTER — Other Ambulatory Visit: Payer: Self-pay

## 2022-09-15 DIAGNOSIS — Z98891 History of uterine scar from previous surgery: Secondary | ICD-10-CM

## 2022-09-15 DIAGNOSIS — F419 Anxiety disorder, unspecified: Secondary | ICD-10-CM

## 2022-09-15 DIAGNOSIS — Z8759 Personal history of other complications of pregnancy, childbirth and the puerperium: Secondary | ICD-10-CM

## 2022-09-15 DIAGNOSIS — Z8279 Family history of other congenital malformations, deformations and chromosomal abnormalities: Secondary | ICD-10-CM

## 2022-09-17 ENCOUNTER — Ambulatory Visit: Payer: BC Managed Care – PPO | Attending: Obstetrics

## 2022-09-17 ENCOUNTER — Other Ambulatory Visit: Payer: Self-pay

## 2022-09-17 DIAGNOSIS — O99212 Obesity complicating pregnancy, second trimester: Secondary | ICD-10-CM | POA: Diagnosis not present

## 2022-09-17 DIAGNOSIS — O09292 Supervision of pregnancy with other poor reproductive or obstetric history, second trimester: Secondary | ICD-10-CM | POA: Diagnosis not present

## 2022-09-17 DIAGNOSIS — Z3A18 18 weeks gestation of pregnancy: Secondary | ICD-10-CM | POA: Diagnosis not present

## 2022-09-17 DIAGNOSIS — O34219 Maternal care for unspecified type scar from previous cesarean delivery: Secondary | ICD-10-CM

## 2022-09-17 DIAGNOSIS — E669 Obesity, unspecified: Secondary | ICD-10-CM | POA: Insufficient documentation

## 2022-09-17 DIAGNOSIS — Z8759 Personal history of other complications of pregnancy, childbirth and the puerperium: Secondary | ICD-10-CM | POA: Insufficient documentation

## 2022-09-17 DIAGNOSIS — Z8279 Family history of other congenital malformations, deformations and chromosomal abnormalities: Secondary | ICD-10-CM | POA: Insufficient documentation

## 2022-09-17 DIAGNOSIS — O99342 Other mental disorders complicating pregnancy, second trimester: Secondary | ICD-10-CM | POA: Insufficient documentation

## 2022-09-17 DIAGNOSIS — Z98891 History of uterine scar from previous surgery: Secondary | ICD-10-CM | POA: Diagnosis not present

## 2022-09-17 DIAGNOSIS — O09522 Supervision of elderly multigravida, second trimester: Secondary | ICD-10-CM | POA: Insufficient documentation

## 2022-09-17 DIAGNOSIS — F419 Anxiety disorder, unspecified: Secondary | ICD-10-CM | POA: Diagnosis not present

## 2022-09-17 NOTE — Patient Instructions (Signed)
Please excuse Mia Williams form work today due to an appointment with his wife @ [facility]. 09/17/22 Thank you, Diannia Ruder , RN [phone]

## 2022-10-15 ENCOUNTER — Ambulatory Visit: Payer: BC Managed Care – PPO

## 2022-10-20 ENCOUNTER — Other Ambulatory Visit: Payer: Self-pay

## 2022-10-20 DIAGNOSIS — Z8279 Family history of other congenital malformations, deformations and chromosomal abnormalities: Secondary | ICD-10-CM

## 2022-10-20 DIAGNOSIS — F419 Anxiety disorder, unspecified: Secondary | ICD-10-CM

## 2022-10-20 DIAGNOSIS — Z8759 Personal history of other complications of pregnancy, childbirth and the puerperium: Secondary | ICD-10-CM

## 2022-10-20 DIAGNOSIS — Z98891 History of uterine scar from previous surgery: Secondary | ICD-10-CM

## 2022-10-22 ENCOUNTER — Ambulatory Visit: Payer: BC Managed Care – PPO | Attending: Obstetrics and Gynecology

## 2022-10-22 ENCOUNTER — Other Ambulatory Visit: Payer: Self-pay

## 2022-10-22 DIAGNOSIS — O35DXX0 Maternal care for other (suspected) fetal abnormality and damage, fetal gastrointestinal anomalies, not applicable or unspecified: Secondary | ICD-10-CM | POA: Diagnosis not present

## 2022-10-22 DIAGNOSIS — Z8279 Family history of other congenital malformations, deformations and chromosomal abnormalities: Secondary | ICD-10-CM

## 2022-10-22 DIAGNOSIS — Z3A23 23 weeks gestation of pregnancy: Secondary | ICD-10-CM

## 2022-10-22 DIAGNOSIS — Z8759 Personal history of other complications of pregnancy, childbirth and the puerperium: Secondary | ICD-10-CM

## 2022-10-22 DIAGNOSIS — O99215 Obesity complicating the puerperium: Secondary | ICD-10-CM | POA: Insufficient documentation

## 2022-10-22 DIAGNOSIS — F419 Anxiety disorder, unspecified: Secondary | ICD-10-CM

## 2022-10-22 DIAGNOSIS — E669 Obesity, unspecified: Secondary | ICD-10-CM

## 2022-10-22 DIAGNOSIS — O34219 Maternal care for unspecified type scar from previous cesarean delivery: Secondary | ICD-10-CM

## 2022-10-22 DIAGNOSIS — O99212 Obesity complicating pregnancy, second trimester: Secondary | ICD-10-CM

## 2022-10-22 DIAGNOSIS — Q410 Congenital absence, atresia and stenosis of duodenum: Secondary | ICD-10-CM | POA: Diagnosis not present

## 2022-10-22 DIAGNOSIS — Z98891 History of uterine scar from previous surgery: Secondary | ICD-10-CM

## 2022-10-22 DIAGNOSIS — O3513X0 Maternal care for (suspected) chromosomal abnormality in fetus, trisomy 21, not applicable or unspecified: Secondary | ICD-10-CM

## 2022-10-22 DIAGNOSIS — O09292 Supervision of pregnancy with other poor reproductive or obstetric history, second trimester: Secondary | ICD-10-CM

## 2022-10-22 DIAGNOSIS — O99342 Other mental disorders complicating pregnancy, second trimester: Secondary | ICD-10-CM | POA: Diagnosis not present

## 2022-10-22 DIAGNOSIS — O09522 Supervision of elderly multigravida, second trimester: Secondary | ICD-10-CM

## 2022-10-23 ENCOUNTER — Telehealth: Payer: Self-pay | Admitting: Certified Nurse Midwife

## 2022-10-23 NOTE — Telephone Encounter (Signed)
I contacted patient via phone. Patient hasn't been seen in a while we were offering patient to come in today at 1:35 pm with Pattricia Boss. I had to leave message for patient to call back to be scheduled.

## 2022-10-27 NOTE — Telephone Encounter (Signed)
Patient is scheduled for 11/02/22 with Raynelle Fanning

## 2022-11-02 ENCOUNTER — Encounter: Payer: Self-pay | Admitting: Medical

## 2022-11-02 ENCOUNTER — Ambulatory Visit (INDEPENDENT_AMBULATORY_CARE_PROVIDER_SITE_OTHER): Payer: BC Managed Care – PPO | Admitting: Medical

## 2022-11-02 VITALS — BP 120/80 | Wt 179.0 lb

## 2022-11-02 DIAGNOSIS — Z98891 History of uterine scar from previous surgery: Secondary | ICD-10-CM

## 2022-11-02 DIAGNOSIS — F419 Anxiety disorder, unspecified: Secondary | ICD-10-CM

## 2022-11-02 DIAGNOSIS — O99342 Other mental disorders complicating pregnancy, second trimester: Secondary | ICD-10-CM

## 2022-11-02 DIAGNOSIS — Z3480 Encounter for supervision of other normal pregnancy, unspecified trimester: Secondary | ICD-10-CM

## 2022-11-02 DIAGNOSIS — Z3A25 25 weeks gestation of pregnancy: Secondary | ICD-10-CM

## 2022-11-02 DIAGNOSIS — O34219 Maternal care for unspecified type scar from previous cesarean delivery: Secondary | ICD-10-CM

## 2022-11-02 NOTE — Patient Instructions (Addendum)
AREA PEDIATRIC/FAMILY PRACTICE PHYSICIANS  Central/Southeast [place] ([address]) [facility] Chambliss, MD; Eniola, MD; Hale, MD; Hensel, MD; McDiarmid, MD; McIntyer, MD; Neal, MD; Walden, MD [address] [place], [place] [address] [phone] Mon-Fri 8:30-12:30, 1:30-5:00 Providers come to see babies at [facility] Accepting Medicaid [facility] providers who accept newborns: Koirala, MD; Morrow, MD; Wolters, MD [address], [place], [place] [address] [phone] Mon-Fri 8:00-5:30 Babies seen by providers at [facility] Does NOT accept Medicaid Please call early in hospitalization for appointment (limited availability)  [facility] Mulberry, MD [address] [place], [place] [address] [phone] Mon, Tue, Thur, Fri 8:30-5:00, Wed 10:00-7:00 (closed 1-2pm) Babies seen by [facility] providers Accepting Medicaid Rubin - Pediatrician Rubin, MD [address], [place], [place] [address] [phone] Mon-Fri 8:30-5:00, Sat 8:30-12:00 Provider comes to see babies at [facility] Accepting Medicaid Must have been referred from current patients or contacted office prior to delivery [facility] ([facility]) Brown, MD; Chandler, MD; Ettefagh, MD; Grant, MD; Lester, MD; McCormick, MD; McQueen, MD; Prose, MD; Simha, MD; Stanley, MD; Stryffeler, NP; Tebben, NP [address], [place], [place] [address] [phone] Mon, Tue, Thur, Fri 8:30-5:30, Wed 9:30-5:30, Sat 8:30-12:30 Babies seen by [facility] providers Accepting Medicaid Only accepting infants of first-time parents or siblings of current patients Hospital discharge coordinator will make follow-up appointment Jack Amos [address], [place], [place]  [address] [phone]   Fax - [phone] [facility] [address], [place], [place]  [address] Phone - [phone]   Fax - [phone] Shilpa Gosrani [address], [place], [place]  [address] [phone]  East/Northeast [place] ([address]) [facility] Bates, MD; Brassfield, MD; Cooper, Cox, MD; MD; Davis, MD; Dovico, MD; Ettefaugh, MD; Little, MD; Lowe, MD; Keiffer, MD; Melvin, MD; Sumner, MD; Williams, MD [address], [place], [place] [address] [phone] Mon-Fri 8:30-5:00 (extended evenings Mon-Thur as needed), Sat-Sun 10:00-1:00 Providers come to see babies at [facility] Accepting Medicaid for families of first-time babies and families with all children in the household age 3 and under. Must register with office prior to making appointment (M-F only). [facility] Henson, NP; Knapp, MD; Lalonde, MD; Tysinger, PA [address] [place], [place] [address] [phone] Mon-Fri 8:00-5:00 Babies seen by providers at [facility] Does NOT accept Medicaid/Commercial Insurance Only [facility] ([facility])  Artis, MD; Barnes, MD; Bratton, MD; Coccaro, MD; [place] Gardner, MD; Kramer, MD; Marshall, MD; Netherton, MD; Poleto, MD; Skinner, MD [address] [place], [place] [address] [phone] Mon-Fri 8:30-5:30, Sat (Oct.-Mar.) 9:00-1:00 Babies seen by providers at [facility] Accepting Medicaid  [place] ([address]) [facility] Reid, MD; Warner, MD [address], [place], [place] [address] [phone] Mon-Fri 8:30-5:00, Sat 8:30-12:00 Providers come to see babies at [facility] Does NOT accept Medicaid [facility] Becker, PA; Hagler, MD; Scifres, PA; Sun, MD; Swayne, MD [address], [place], [place] [address] [phone] Mon-Fri 8:00-5:00 Babies seen by providers at [facility] Does NOT accept Medicaid Only accepting babies of parents who  are patients Please call early in hospitalization for appointment (limited availability) [facility] Clark, MD; Frye, MD; Kelleher, MD; Mack, NP; Miller, MD; O'Keller, MD; Patterson, NP; Pudlo, MD; Puzio, MD; Thomas, MD; Tucker, MD; Twiselton, MD [address], [place], [place] [address] [phone] Mon-Fri 8:00-5:00, Sat 9:00-12:00 Providers come to see babies at [facility] Does NOT accept Medicaid  [place] ([address]) [facility] providers accepting new patients: Brake, NP; Wharton, PA [address], [place], [place] [address] [phone] Mon-Fri 8:00-5:00 Babies seen by providers at [facility] Does NOT accept Medicaid Only accepting babies of parents who are patients Please call early in hospitalization for appointment (limited availability) [facility] Gay, MD; Quinlan, MD [address] [place], [place] [address] [phone] (press 1 to schedule appointment) Mon-Fri 8:00-5:00 Providers come to see babies at [facility] Does NOT accept Medicaid [facility] Mazer, MD [address] [place], [place] [address] [phone] Mon-Fri 8:30-5:00 (lunch 12:30-1:00), extended hours by appointment only Wed 5:00-6:30 Babies seen by [facility] providers Accepting Medicaid [facility] Banks, MD; Jordan, MD; Koberlein, MD [address], [place], [place] [address] [phone] Mon-Fri 8:00-5:00 Babies seen by [facility] providers Does NOT accept Medicaid [facility] Parker, MD; Hunter, MD; Wallace, DO [address] [place], [place] [address] [phone] Mon-Fri 8:00-5:00 Babies seen by [facility] providers Does NOT accept Medicaid [facility] Brandon, PA; Brecken, PA; Christy, NP; Dees, MD; DeClaire, MD; DeWeese, MD; Hansen, NP; Mills, NP; Parrish, NP; Smoot, NP; Summer, MD; Vapne,  MD [address] [place], [place] [address] [phone] Mon-Fri 8:30-5:00, Sat 10:00-1:00 Providers come to see babies at [facility] Does NOT accept Medicaid Free prenatal information session Tuesdays at 4:45pm [facility] Bouska, MD; Gordon, PA; Jeffery, PA; Weber, PA [address] [place] [address] [phone] Mon-Fri 7:30-5:30 Babies seen by [facility] providers [facility] [address], [place], [place]  [address] [phone]   Fax - [phone]  [place] ([address] & [address]) [facility] [place] [address] [place], [place] [address] [phone] Mon-Thur 8:00-6:00 Providers come to see babies at [facility] Accepting Medicaid [facility] Anderson, NP; Badger, MD; Beal, PA; Spencer, PA [address] [place], [place] [address] [phone] Mon-Thur 7:30-7:30, Fri 7:30-4:30 Babies seen by [facility] providers Accepting Medicaid [facility] Agbuya, MD; Klett, NP; Romgoolam, MD [address], [place], [place] [address] [phone] Mon-Fri 8:30-5:00, Sat 8:30-12:00 Providers come to see babies at [facility] Accepting Medicaid Must have "Meet & Greet" appointment at office prior to delivery [facility] ([facility]) McCord, MD; Wallace, MD; Wood, MD [address], [place], [place] [address] [phone] Mon-Wed 8:00-6:00, Thur-Fri 8:00-5:00, Sat 9:00-12:00 Providers come to see babies at [facility] Does NOT accept Medicaid Only accepting siblings of current patients [facility]  [address], [place], [place]  [address] [phone]   Fax - [phone] [facility] [address], [place], [place]  [address] [phone]   Fax -  [phone]  [place]west  ([address] & [address]) [facility] Cirigliano, DO; Matthews, DO [address] ,  [address] [phone] Mon-Fri 7:00-5:00 Babies seen by [facility] providers Does NOT accept Medicaid [facility] Briscoe, MD; Howley, PA; Moreira, PA [address], [place],  [address] [phone] Mon-Fri 8:00-5:00 Babies seen by [facility] providers Accepting Medicaid [facility] Boyd, MD; Church, PA; Jones, NP; Osborn, PA [address], ,  [address] [phone] Mon-Fri 8:00-5:00 Babies seen by providers at [facility] Accepting Medicaid  [place] ([address]) [facility] [place] Wendling, DO [address] [place], [place] [address] [phone] Mon-Fri 8:00-5:00 Babies seen by [facility] providers Does NOT accept Medicaid Limited availability, please call early in hospitalization to schedule follow-up [facility] Calderon, PA; Cummings, MD; Dillard, MD; Martin, PA; Olson, MD; VanDeven, PA [address], [place], [place] [address] [phone] Mon-Fri 8:30-5:00, Sat 9:00-12:00 Babies seen by providers at [facility] Accepting Medicaid Please register online then schedule online or call office [email or web address]) Hunter, NP; Kumar, MD; Martin Rogers, PA [address], [place], [place] [address] [phone] Mon-Fri 8:00-5:00 Babies seen by providers at [facility] Accepting Medicaid [facility]) Glasgow, MD; Kristi Fleenor, NP; West, MD [address], [place], [place] [address] [phone] Mon-Fri 8:00-5:30, Sat&Sun by appointment (phones open at  8:30) Babies seen by [facility] providers Accepting Medicaid Must be a first-time baby or sibling of current patient [facility] [place]  [address], [place], [place]  [address] [phone]   Fax - [phone]  [place] ([address]) [facility] Brown, PA; Cowen, PA; Rice, MD; Helton, PA; Spry, MD [address] [place], [place] [address] [phone] Mon-Thur 8:00-7:00, Fri 8:00-5:00, Sat 8:00-12:00, Sun 9:00-12:00 Babies seen by [facility] providers Accepting Medicaid [facility] Coe-Goins, MD; Marshall, MD; Pierre-Louis, MD [address], [place], [place] [address] [phone] Mon-Thur 8:00-5:00 Babies seen by providers at [facility] Accepting Medicaid [facility] Bratton, MD; Coe-Goins, MD; Hayes, MD; Lewis, MD; List, MD; Lott, MD; Marshall, MD; Moran, MD; O'Neal, MD; Pierre-Louis, MD; Pitonzo, MD; Scholer, MD; Spangle, MD [address] [place], [place] [address] [phone] Mon-Fri 8:00-5:30, Sat (Oct.-Mar.) 9:00-1:00 Babies seen by providers at [facility] Accepting Medicaid Must fill out new patient packet, available online at [email or web address] [facility] ([facility]) Friddle, NP; Harris, NP; Kelly, NP; Logan, MD; Melvin, PA; Poth, MD; Ramadoss, MD; Stanton, NP [address], [place], [place] [address] [phone] Mon-Thur 8:00-5:30, Fri 8:00-5:00 Babies seen by providers at [facility] Accepting Medicaid  [place] ([address]) [facility] Dixon, PA; Kalaheo, MD; Pickard, MD; Tapia, PA [address], [place], [place] [address] [phone] Mon-Fri 8:00-5:00 Babies seen by providers at [facility] Accepting Medicaid   [place] ([address]) [facility] [place] Masneri, DO; Meyers, MD; Nelson, PA [address], [place], [place] [address] [phone] Mon-Fri 8:00-5:00 Babies seen by providers at [facility] Does NOT accept Medicaid Limited appointment availability, please call early in hospitalization   [facility] [place] Kunedd, DO; McGowen, MD [address], [place], [place] [address] [phone] Mon-Fri 8:00-5:00 Babies seen by [facility] providers Does NOT accept Medicaid [facility] [place] Cameron, MD; MacDonald, MD; Michaels, PA; Nayak, MD [address] [place] [address], [place], [place] [address] [phone] Mon-Fri 8:00-5:00 After hours clinic ([address] [place], [place] [address]) [phone] Mon-Fri 5:00-8:00, Sat 12:00-6:00, Sun 10:00-4:00 Babies seen by [facility] providers Accepting Medicaid [facility] [place] [address] [place], [place]  [address] [phone]   Fax - [phone]  [place] [phone]) Adult nurse [facility], MD [address] [place], [place], [place] [address] [phone] Mon-Fri 8:00-5:00 Babies seen by [facility] providers Does NOT accept Medicaid [facility] [place] [facility] [place]) Rene Kocher, MD [address] [place], [place] [address] [phone] Mon-Thur 8:00-7:00, Fri 8:00-5:00, Sat 8:00-12:00 Babies seen by providers at [facility] Accepting Medicaid - but does not have vaccinations in office (must be received elsewhere) Limited availability, please call early in hospitalization  [place] [phone]) [facility]  Wyvonne Lenz, MD [address] [place] [address] [phone]  Fax [phone]  [facility]  Lyndel Safe, MD, [place], [place], [place], [place] [address] [place], [place]  [address] [phone] [facility]  [address] [place], [place] [address] [phone] [address] [place], [place] [address] [phone] [facility] Office)  [facility] [address], [place], [place] [address] [phone] [facility] [address] [place], [place], [place] [address] [phone] [facility] [address], [place], [place] [address] [phone] [facility] [address] [place] [address], [place], [place] [address] [phone] [facility] [address] [place], [place] [address] [phone] [facility] [address], [place], [place] [address] [phone] [facility]  [address] [place], [place] [address] [phone] Dr. Belia Heman. [place] [address], [place], [place] [address] [phone] [facility] [address], [place], [place] [address] [phone] [facility] [address] [place], [place] [address] [phone]   Remember that at your next visit you will get labs drawn. You need to be fasting (nothing to eat or drink for at least 8 hours prior to arrival) for those labs. We will be testing for gestational diabetes, anemia, HIV and syphilis. You will be in the office for at least 2 hours to complete the lab tests and your visit will occur during this time. You will also be offered the TDAP vaccine for whooping cough.  TDaP Vaccine Pregnancy Get the Whooping Cough Vaccine While You Are Pregnant (CDC)  It is important for women to get the whooping cough vaccine in the third trimester of each pregnancy. Vaccines are the best way to prevent this disease. There are 2 different whooping cough vaccines. Both vaccines combine protection against whooping cough, tetanus and diphtheria, but they are for different age groups: Tdap: for everyone 11 years or older, including pregnant women  DTaP: for children 2 months through 86  years of age  You need the whooping cough vaccine during each of your pregnancies The recommended time to get the shot is during your 27th through 36th week of pregnancy, preferably during the earlier part of this time period. The Centers for Disease Control and Prevention (CDC) recommends that pregnant women receive the whooping cough vaccine for adolescents and adults (called Tdap vaccine) during the third trimester of each pregnancy. The recommended time to get the shot is during your 27th through 36th week of pregnancy, preferably during the earlier part of this time period. This replaces the original recommendation that pregnant women get the vaccine only if they had not previously received it. The [employer] of Obstetricians and Gynecologists and the [employer] support this recommendation.  You should get the whooping cough vaccine while pregnant to pass protection to your baby frame support disabled  and/or not supported in this browser  Learn why Mickel Baas decided to get the whooping cough vaccine in her 3rd trimester of pregnancy and how her baby girl was born with some protection against the disease. Also available on YouTube. After receiving the whooping cough vaccine, your body will create protective antibodies (proteins produced by the body to fight off diseases) and pass some of them to your baby before birth. These antibodies provide your baby some short-term protection against whooping cough in early life. These antibodies can also protect your baby from some of the more serious complications that come along with whooping cough. Your protective antibodies are at their highest about 2 weeks after getting the vaccine, but it takes time to pass them to your baby. So the preferred time to get the whooping cough vaccine is early in your third trimester. The amount of whooping cough antibodies in your body decreases over time. That is why CDC recommends you get a  whooping cough vaccine during each pregnancy. Doing so allows each of your babies to get the greatest number of protective antibodies from you. This means each of your babies will get the best protection possible against this disease.  Getting the whooping cough vaccine while pregnant is better than getting the vaccine after you give birth Whooping cough vaccination during pregnancy is ideal so your baby will have short-term protection as soon as he is born. This early protection is important because your baby will not start getting his whooping cough vaccines until he is 2 months old. These first few months of life are when your baby is at greatest risk for catching whooping cough. This is also when he's at greatest risk for having severe, potentially life-threating complications from the infection. To avoid that gap in protection, it is best to get a whooping cough vaccine during pregnancy. You will then pass protection to your baby before he is born. To continue protecting your baby, he should get whooping cough vaccines starting at 2 months old. You may never have gotten the Tdap vaccine before and did not get it during this pregnancy. If so, you should make sure to get the vaccine immediately after you give birth, before leaving the hospital or birthing center. It will take about 2 weeks before your body develops protection (antibodies) in response to the vaccine. Once you have protection from the vaccine, you are less likely to give whooping cough to your newborn while caring for him. But remember, your baby will still be at risk for catching whooping cough from others. A recent study looked to see how effective Tdap was at preventing whooping cough in babies whose mothers got the vaccine while pregnant or in the hospital after giving birth. The study found that getting Tdap between 27 through 36 weeks of pregnancy is 85% more effective at preventing whooping cough in babies younger than 2 months  old. Blood tests cannot tell if you need a whooping cough vaccine There are no blood tests that can tell you if you have enough antibodies in your body to protect yourself or your baby against whooping cough. Even if you have been sick with whooping cough in the past or previously received the vaccine, you still should get the vaccine during each pregnancy. Breastfeeding may pass some protective antibodies onto your baby By breastfeeding, you may pass some antibodies you have made in response to the vaccine to your baby. When you get a whooping cough vaccine during your pregnancy, you will have antibodies  in your breast milk that you can share with your baby as soon as your milk comes in. However, your baby will not get protective antibodies immediately if you wait to get the whooping cough vaccine until after delivering your baby. This is because it takes about 2 weeks for your body to create antibodies. Learn more about the health benefits of breastfeeding.

## 2022-11-02 NOTE — Progress Notes (Signed)
   PRENATAL VISIT NOTE  Subjective:  Mia Williams is a 36 y.o. G3P0110 at [redacted]w[redacted]d being seen today for ongoing prenatal care.  She is currently monitored for the following issues for this high-risk pregnancy and has Anxiety; History of cesarean section; and Supervision of other normal pregnancy, antepartum on their problem list.  Patient reports  occasional round ligament pain .  Contractions: Not present. Vag. Bleeding: None.  Movement: Present. Denies leaking of fluid.   The following portions of the patient's history were reviewed and updated as appropriate: allergies, current medications, past family history, past medical history, past social history, past surgical history and problem list.   Objective:   Vitals:   11/02/22 1516  BP: 120/80  Weight: 179 lb (81.2 kg)    Fetal Status: Fetal Heart Rate (bpm): 152 Fundal Height: 24 cm Movement: Present     General:  Alert, oriented and cooperative. Patient is in no acute distress.  Skin: Skin is warm and dry. No rash noted.   Cardiovascular: N/A  Respiratory: Normal respiratory effort, no problems with respiration noted  Abdomen: Soft, gravid, appropriate for gestational age.  Pain/Pressure: Absent     Pelvic: Cervical exam deferred        Extremities: Normal range of motion.     Mental Status: Normal mood and affect. Normal behavior. Normal judgment and thought content.   Assessment and Plan:  Pregnancy: G3P0110 at [redacted]w[redacted]d 1. Supervision of other normal pregnancy, antepartum - Anticipatory guidance for GTT, CBC, HIV, RPR and TDAP at next visit reviewed   2. History of cesarean section - [facility] incision at [facility], plan for repeat at 37 weeks per MFM  - Followed by MFM for growth due to history of FGR with previously pregnancy, last EFW 40%, next [place] scheduled 12/2022  3. Anxiety  Preterm labor symptoms and general obstetric precautions including but not limited to vaginal bleeding, contractions, leaking of fluid and fetal  movement were reviewed in detail with the patient. Please refer to After Visit Summary for other counseling recommendations.   Return in about 4 weeks (around 11/30/2022) for [facility] MD only.  Future Appointments  Date Time Provider Department Center  11/02/2022  3:35 PM Marny Lowenstein, PA-C [facility] None  12/24/2022  4:00 PM [facility]    Vonzella Nipple, PA-C

## 2022-12-02 ENCOUNTER — Encounter: Payer: Self-pay | Admitting: Obstetrics and Gynecology

## 2022-12-02 ENCOUNTER — Other Ambulatory Visit: Payer: BC Managed Care – PPO

## 2022-12-02 ENCOUNTER — Ambulatory Visit (INDEPENDENT_AMBULATORY_CARE_PROVIDER_SITE_OTHER): Payer: BC Managed Care – PPO | Admitting: Obstetrics and Gynecology

## 2022-12-02 ENCOUNTER — Other Ambulatory Visit: Payer: Self-pay

## 2022-12-02 VITALS — BP 125/84 | HR 86 | Wt 179.6 lb

## 2022-12-02 DIAGNOSIS — Z3480 Encounter for supervision of other normal pregnancy, unspecified trimester: Secondary | ICD-10-CM

## 2022-12-02 DIAGNOSIS — Z23 Encounter for immunization: Secondary | ICD-10-CM | POA: Diagnosis not present

## 2022-12-02 DIAGNOSIS — Z3A29 29 weeks gestation of pregnancy: Secondary | ICD-10-CM

## 2022-12-02 DIAGNOSIS — Z131 Encounter for screening for diabetes mellitus: Secondary | ICD-10-CM

## 2022-12-02 DIAGNOSIS — O34219 Maternal care for unspecified type scar from previous cesarean delivery: Secondary | ICD-10-CM

## 2022-12-02 DIAGNOSIS — Z113 Encounter for screening for infections with a predominantly sexual mode of transmission: Secondary | ICD-10-CM

## 2022-12-02 DIAGNOSIS — Z98891 History of uterine scar from previous surgery: Secondary | ICD-10-CM

## 2022-12-02 NOTE — Progress Notes (Signed)
ROB: No problems.  Denies contractions.  Reports daily fetal movement.  1 hour GCT today.  Discussed delivery at 37 weeks for prior vertical uterine incision.  Scheduled this at next visit.

## 2022-12-02 NOTE — Progress Notes (Signed)
ROB. She states daily fetal movement. Patient completed GCT, received TDAP injection and signed BTC today. She states no questions or concerns at this time.

## 2022-12-03 ENCOUNTER — Encounter: Payer: Self-pay | Admitting: Obstetrics and Gynecology

## 2022-12-03 ENCOUNTER — Other Ambulatory Visit: Payer: Self-pay

## 2022-12-03 DIAGNOSIS — Z131 Encounter for screening for diabetes mellitus: Secondary | ICD-10-CM

## 2022-12-03 LAB — 28 WEEK RH+PANEL
Basophils Absolute: 0 10*3/uL (ref 0.0–0.2)
Basos: 0 %
EOS (ABSOLUTE): 0.1 10*3/uL (ref 0.0–0.4)
Eos: 1 %
Gestational Diabetes Screen: 145 mg/dL — ABNORMAL HIGH (ref 70–139)
HIV Screen 4th Generation wRfx: NONREACTIVE
Hematocrit: 36.2 % (ref 34.0–46.6)
Hemoglobin: 12.5 g/dL (ref 11.1–15.9)
Immature Grans (Abs): 0.1 10*3/uL (ref 0.0–0.1)
Immature Granulocytes: 1 %
Lymphocytes Absolute: 1.7 10*3/uL (ref 0.7–3.1)
Lymphs: 15 %
MCH: 30.9 pg (ref 26.6–33.0)
MCHC: 34.5 g/dL (ref 31.5–35.7)
MCV: 89 fL (ref 79–97)
Monocytes Absolute: 0.3 10*3/uL (ref 0.1–0.9)
Monocytes: 3 %
Neutrophils Absolute: 9.6 10*3/uL — ABNORMAL HIGH (ref 1.4–7.0)
Neutrophils: 80 %
Platelets: 196 10*3/uL (ref 150–450)
RBC: 4.05 x10E6/uL (ref 3.77–5.28)
RDW: 13.3 % (ref 11.7–15.4)
RPR Ser Ql: NONREACTIVE
WBC: 11.8 10*3/uL — ABNORMAL HIGH (ref 3.4–10.8)

## 2022-12-04 ENCOUNTER — Other Ambulatory Visit: Payer: BC Managed Care – PPO

## 2022-12-04 ENCOUNTER — Other Ambulatory Visit: Payer: Self-pay

## 2022-12-04 DIAGNOSIS — Z3A29 29 weeks gestation of pregnancy: Secondary | ICD-10-CM

## 2022-12-04 DIAGNOSIS — Z3480 Encounter for supervision of other normal pregnancy, unspecified trimester: Secondary | ICD-10-CM

## 2022-12-04 DIAGNOSIS — Z131 Encounter for screening for diabetes mellitus: Secondary | ICD-10-CM

## 2022-12-05 LAB — GESTATIONAL GLUCOSE TOLERANCE
Glucose, Fasting: 75 mg/dL (ref 70–94)
Glucose, GTT - 1 Hour: 140 mg/dL (ref 70–179)
Glucose, GTT - 2 Hour: 157 mg/dL — ABNORMAL HIGH (ref 70–154)
Glucose, GTT - 3 Hour: 115 mg/dL (ref 70–139)

## 2022-12-11 ENCOUNTER — Telehealth: Payer: Self-pay

## 2022-12-11 NOTE — Telephone Encounter (Signed)
Called [place] back and told her the results was 115 and was in range.

## 2022-12-11 NOTE — Telephone Encounter (Signed)
Mia Williams called triage stated she got her 3 hour glucose test results from  [facility] and wanted to know if she was reading her results right and if it was in range.

## 2022-12-17 ENCOUNTER — Telehealth: Payer: Self-pay

## 2022-12-17 NOTE — Telephone Encounter (Signed)
Patient called to let Providers know that she tested positive for Covid. I advised patient to treat symptoms according to SafeMedication list. List was provided for her via mychart. Advised her to stay hydrated. If she got a fever over 101 or sob follow up at ED or Urgent Care.   Please advise.

## 2022-12-22 ENCOUNTER — Other Ambulatory Visit: Payer: Self-pay

## 2022-12-22 DIAGNOSIS — Z8279 Family history of other congenital malformations, deformations and chromosomal abnormalities: Secondary | ICD-10-CM

## 2022-12-22 DIAGNOSIS — Z8759 Personal history of other complications of pregnancy, childbirth and the puerperium: Secondary | ICD-10-CM

## 2022-12-22 DIAGNOSIS — Z98891 History of uterine scar from previous surgery: Secondary | ICD-10-CM

## 2022-12-22 DIAGNOSIS — Z87738 Personal history of other specified (corrected) congenital malformations of digestive system: Secondary | ICD-10-CM

## 2022-12-22 DIAGNOSIS — F419 Anxiety disorder, unspecified: Secondary | ICD-10-CM

## 2022-12-23 ENCOUNTER — Ambulatory Visit (INDEPENDENT_AMBULATORY_CARE_PROVIDER_SITE_OTHER): Payer: BC Managed Care – PPO | Admitting: Obstetrics and Gynecology

## 2022-12-23 ENCOUNTER — Encounter: Payer: Self-pay | Admitting: Obstetrics and Gynecology

## 2022-12-23 VITALS — BP 145/88 | HR 74 | Wt 184.0 lb

## 2022-12-23 DIAGNOSIS — Z98891 History of uterine scar from previous surgery: Secondary | ICD-10-CM

## 2022-12-23 DIAGNOSIS — Z8759 Personal history of other complications of pregnancy, childbirth and the puerperium: Secondary | ICD-10-CM

## 2022-12-23 DIAGNOSIS — Z3A32 32 weeks gestation of pregnancy: Secondary | ICD-10-CM

## 2022-12-23 DIAGNOSIS — O99810 Abnormal glucose complicating pregnancy: Secondary | ICD-10-CM | POA: Insufficient documentation

## 2022-12-23 DIAGNOSIS — O34219 Maternal care for unspecified type scar from previous cesarean delivery: Secondary | ICD-10-CM

## 2022-12-23 DIAGNOSIS — O0993 Supervision of high risk pregnancy, unspecified, third trimester: Secondary | ICD-10-CM

## 2022-12-23 LAB — POCT URINALYSIS DIPSTICK OB
Bilirubin, UA: NEGATIVE
Blood, UA: NEGATIVE
Glucose, UA: NEGATIVE
Ketones, UA: NEGATIVE
Leukocytes, UA: NEGATIVE
Nitrite, UA: NEGATIVE
POC,PROTEIN,UA: NEGATIVE
Spec Grav, UA: 1.01 (ref 1.010–1.025)
Urobilinogen, UA: 0.2 E.U./dL
pH, UA: 6.5 (ref 5.0–8.0)

## 2022-12-23 NOTE — Progress Notes (Signed)
ROB: Patient is a 37 y.o. G3P0110 at [redacted]w[redacted]d who presents for routine PNC.  Note feeling good fetal movement, feels more like spinning than kicks. BP elevated today, no prior h/o HTN. Negative proteinuria. Will continue to monitor. Has MFM appointment tomorrow. Discussed plans for C-section, patient with prior vertial incision.  Will plan for delivery at 37 weeks. Tentatively scheduled for 2/19. Patient also has questions regarding circumcision. Has not yet selected Pediatrician. Would desire for me (Dr. Marcelline Mates) to perform if possible. RTC in 2 weeks unless BP elevated again at MFM, then can f/u in 1 week.

## 2022-12-24 ENCOUNTER — Other Ambulatory Visit: Payer: Self-pay | Admitting: Maternal & Fetal Medicine

## 2022-12-24 ENCOUNTER — Ambulatory Visit: Payer: BC Managed Care – PPO

## 2022-12-24 ENCOUNTER — Other Ambulatory Visit: Payer: Self-pay

## 2022-12-24 ENCOUNTER — Ambulatory Visit: Payer: BC Managed Care – PPO | Attending: Maternal & Fetal Medicine

## 2022-12-24 DIAGNOSIS — Z8279 Family history of other congenital malformations, deformations and chromosomal abnormalities: Secondary | ICD-10-CM

## 2022-12-24 DIAGNOSIS — O99343 Other mental disorders complicating pregnancy, third trimester: Secondary | ICD-10-CM

## 2022-12-24 DIAGNOSIS — Z98891 History of uterine scar from previous surgery: Secondary | ICD-10-CM

## 2022-12-24 DIAGNOSIS — O26893 Other specified pregnancy related conditions, third trimester: Secondary | ICD-10-CM | POA: Diagnosis not present

## 2022-12-24 DIAGNOSIS — O133 Gestational [pregnancy-induced] hypertension without significant proteinuria, third trimester: Secondary | ICD-10-CM | POA: Diagnosis not present

## 2022-12-24 DIAGNOSIS — O365930 Maternal care for other known or suspected poor fetal growth, third trimester, not applicable or unspecified: Secondary | ICD-10-CM

## 2022-12-24 DIAGNOSIS — Z8759 Personal history of other complications of pregnancy, childbirth and the puerperium: Secondary | ICD-10-CM

## 2022-12-24 DIAGNOSIS — Z87738 Personal history of other specified (corrected) congenital malformations of digestive system: Secondary | ICD-10-CM

## 2022-12-24 DIAGNOSIS — F419 Anxiety disorder, unspecified: Secondary | ICD-10-CM

## 2022-12-24 DIAGNOSIS — Z362 Encounter for other antenatal screening follow-up: Secondary | ICD-10-CM | POA: Diagnosis not present

## 2022-12-24 DIAGNOSIS — O09523 Supervision of elderly multigravida, third trimester: Secondary | ICD-10-CM | POA: Insufficient documentation

## 2022-12-24 DIAGNOSIS — Z3A32 32 weeks gestation of pregnancy: Secondary | ICD-10-CM | POA: Diagnosis not present

## 2022-12-24 DIAGNOSIS — O34219 Maternal care for unspecified type scar from previous cesarean delivery: Secondary | ICD-10-CM

## 2022-12-24 DIAGNOSIS — O09293 Supervision of pregnancy with other poor reproductive or obstetric history, third trimester: Secondary | ICD-10-CM | POA: Diagnosis not present

## 2022-12-24 NOTE — Procedures (Signed)
RABIA ARGOTE 30-Jan-1986 [redacted]w[redacted]d  Fetus A Non-Stress Test Interpretation for 12/24/22  Indication: IUGR  Fetal Heart Rate A Mode: External Baseline Rate (A): 140 bpm Variability: Moderate Accelerations: 15 x 15 Decelerations: None Multiple birth?: No  Uterine Activity Mode: Toco  Interpretation (Fetal Testing) Nonstress Test Interpretation: Reactive (Dr.Booker interpreted NST)

## 2022-12-28 ENCOUNTER — Encounter: Payer: Self-pay | Admitting: Obstetrics and Gynecology

## 2022-12-29 ENCOUNTER — Other Ambulatory Visit: Payer: Self-pay

## 2022-12-29 DIAGNOSIS — Z98891 History of uterine scar from previous surgery: Secondary | ICD-10-CM

## 2022-12-29 DIAGNOSIS — Z8759 Personal history of other complications of pregnancy, childbirth and the puerperium: Secondary | ICD-10-CM

## 2022-12-29 DIAGNOSIS — Z87738 Personal history of other specified (corrected) congenital malformations of digestive system: Secondary | ICD-10-CM

## 2022-12-29 DIAGNOSIS — Z8279 Family history of other congenital malformations, deformations and chromosomal abnormalities: Secondary | ICD-10-CM

## 2022-12-29 DIAGNOSIS — O365990 Maternal care for other known or suspected poor fetal growth, unspecified trimester, not applicable or unspecified: Secondary | ICD-10-CM

## 2022-12-29 DIAGNOSIS — F419 Anxiety disorder, unspecified: Secondary | ICD-10-CM

## 2022-12-31 ENCOUNTER — Encounter: Payer: Self-pay | Admitting: Obstetrics

## 2022-12-31 ENCOUNTER — Observation Stay (HOSPITAL_BASED_OUTPATIENT_CLINIC_OR_DEPARTMENT_OTHER): Payer: BC Managed Care – PPO | Admitting: Obstetrics

## 2022-12-31 ENCOUNTER — Other Ambulatory Visit: Payer: Self-pay

## 2022-12-31 ENCOUNTER — Ambulatory Visit (HOSPITAL_BASED_OUTPATIENT_CLINIC_OR_DEPARTMENT_OTHER): Payer: BC Managed Care – PPO

## 2022-12-31 ENCOUNTER — Observation Stay
Admission: RE | Admit: 2022-12-31 | Discharge: 2022-12-31 | Disposition: A | Discharge status: Home / Self Care | Payer: BC Managed Care – PPO | Source: Home / Self Care | Attending: Obstetrics | Admitting: Obstetrics

## 2022-12-31 DIAGNOSIS — Z3A33 33 weeks gestation of pregnancy: Secondary | ICD-10-CM | POA: Insufficient documentation

## 2022-12-31 DIAGNOSIS — F419 Anxiety disorder, unspecified: Secondary | ICD-10-CM | POA: Insufficient documentation

## 2022-12-31 DIAGNOSIS — Z362 Encounter for other antenatal screening follow-up: Secondary | ICD-10-CM | POA: Diagnosis not present

## 2022-12-31 DIAGNOSIS — Z87738 Personal history of other specified (corrected) congenital malformations of digestive system: Secondary | ICD-10-CM | POA: Insufficient documentation

## 2022-12-31 DIAGNOSIS — O163 Unspecified maternal hypertension, third trimester: Secondary | ICD-10-CM

## 2022-12-31 DIAGNOSIS — O09293 Supervision of pregnancy with other poor reproductive or obstetric history, third trimester: Secondary | ICD-10-CM | POA: Insufficient documentation

## 2022-12-31 DIAGNOSIS — O133 Gestational [pregnancy-induced] hypertension without significant proteinuria, third trimester: Secondary | ICD-10-CM | POA: Diagnosis not present

## 2022-12-31 DIAGNOSIS — O365930 Maternal care for other known or suspected poor fetal growth, third trimester, not applicable or unspecified: Secondary | ICD-10-CM

## 2022-12-31 DIAGNOSIS — O99343 Other mental disorders complicating pregnancy, third trimester: Secondary | ICD-10-CM | POA: Insufficient documentation

## 2022-12-31 DIAGNOSIS — Z8759 Personal history of other complications of pregnancy, childbirth and the puerperium: Secondary | ICD-10-CM | POA: Insufficient documentation

## 2022-12-31 DIAGNOSIS — R030 Elevated blood-pressure reading, without diagnosis of hypertension: Secondary | ICD-10-CM | POA: Insufficient documentation

## 2022-12-31 DIAGNOSIS — O365990 Maternal care for other known or suspected poor fetal growth, unspecified trimester, not applicable or unspecified: Secondary | ICD-10-CM

## 2022-12-31 DIAGNOSIS — Z8279 Family history of other congenital malformations, deformations and chromosomal abnormalities: Secondary | ICD-10-CM | POA: Insufficient documentation

## 2022-12-31 DIAGNOSIS — O26893 Other specified pregnancy related conditions, third trimester: Secondary | ICD-10-CM | POA: Insufficient documentation

## 2022-12-31 DIAGNOSIS — O34219 Maternal care for unspecified type scar from previous cesarean delivery: Secondary | ICD-10-CM | POA: Insufficient documentation

## 2022-12-31 DIAGNOSIS — O09523 Supervision of elderly multigravida, third trimester: Secondary | ICD-10-CM | POA: Diagnosis not present

## 2022-12-31 DIAGNOSIS — Z98891 History of uterine scar from previous surgery: Secondary | ICD-10-CM

## 2022-12-31 LAB — COMPREHENSIVE METABOLIC PANEL
ALT: 10 U/L (ref 0–44)
AST: 19 U/L (ref 15–41)
Albumin: 3.2 g/dL — ABNORMAL LOW (ref 3.5–5.0)
Alkaline Phosphatase: 86 U/L (ref 38–126)
Anion gap: 9 (ref 5–15)
BUN: 15 mg/dL (ref 6–20)
CO2: 19 mmol/L — ABNORMAL LOW (ref 22–32)
Calcium: 9.6 mg/dL (ref 8.9–10.3)
Chloride: 107 mmol/L (ref 98–111)
Creatinine, Ser: 0.53 mg/dL (ref 0.44–1.00)
GFR, Estimated: >60 mL/min (ref >60)
Glucose, Bld: 80 mg/dL (ref 70–99)
Potassium: 3.7 mmol/L (ref 3.5–5.1)
Sodium: 135 mmol/L (ref 135–145)
Total Bilirubin: 0.4 mg/dL (ref 0.3–1.2)
Total Protein: 6.6 g/dL (ref 6.5–8.1)

## 2022-12-31 LAB — CBC
HCT: 35.1 % — ABNORMAL LOW (ref 36.0–46.0)
Hemoglobin: 11.8 g/dL — ABNORMAL LOW (ref 12.0–15.0)
MCH: 30.3 pg (ref 26.0–34.0)
MCHC: 33.6 g/dL (ref 30.0–36.0)
MCV: 90.2 fL (ref 80.0–100.0)
Platelets: 165 10*3/uL (ref 150–400)
RBC: 3.89 MIL/uL (ref 3.87–5.11)
RDW: 15.2 % (ref 11.5–15.5)
WBC: 11.6 10*3/uL — ABNORMAL HIGH (ref 4.0–10.5)
nRBC: 0 % (ref 0.0–0.2)

## 2022-12-31 LAB — PROTEIN / CREATININE RATIO, URINE
Creatinine, Urine: 17 mg/dL
Protein Creatinine Ratio: 0.47 mg/mg{Cre} — ABNORMAL HIGH (ref 0.00–0.15)
Total Protein, Urine: 8 mg/dL

## 2022-12-31 MED ORDER — ACETAMINOPHEN 325 MG PO TABS: 650.0000 mg | ORAL_TABLET | ORAL | Status: DC | PRN

## 2022-12-31 MED ORDER — SOD CITRATE-CITRIC ACID 500-334 MG/5ML PO SOLN: 30.0000 mL | ORAL | Status: DC | PRN

## 2022-12-31 MED ORDER — HYDROXYZINE HCL 25 MG PO TABS: 50.0000 mg | ORAL_TABLET | Freq: Four times a day (QID) | ORAL | Status: DC | PRN

## 2022-12-31 MED ORDER — BETAMETHASONE SOD PHOS & ACET 6 (3-3) MG/ML IJ SUSP
12.0000 mg | INTRAMUSCULAR | Status: DC
  Administered 2022-12-31: 12 mg via INTRAMUSCULAR
  Filled 2022-12-31: qty 5

## 2022-12-31 MED ORDER — ONDANSETRON HCL 4 MG/2ML IJ SOLN: 4.0000 mg | Freq: Four times a day (QID) | INTRAMUSCULAR | Status: DC | PRN

## 2022-12-31 MED ORDER — SERTRALINE HCL 50 MG PO TABS: 50.0000 mg | ORAL_TABLET | Freq: Every day | ORAL | 1 refills | Status: DC

## 2022-12-31 NOTE — OB Triage Note (Addendum)
LABOR & DELIVERY OB TRIAGE NOTE  SUBJECTIVE  HPI Mia Williams is a 37 y.o. G3P0110 at [redacted]w[redacted]d who presents to Labor & Delivery for elevated BPs during her MFM appt. She presented to MFM for a BPP, which was 8/8, and was noted to have BPs of 156/100, 155/99, and 142/99. She denies HA, visual changes, and epigastric pain. Her CMP and CBC are WNL, although her platelets have dropped from 196K tp 165K since 12/02/22. Her  PC ratio is 0.47. She initially had 2 mild range BPs. The remainder have been WNL.  OB History     Gravida  3   Para  1   Term  0   Preterm  1   AB  1   Living  0      SAB  1   IAB      Ectopic      Multiple      Live Births  1           Scheduled Meds: Continuous Infusions: PRN Meds:.acetaminophen, hydrOXYzine, ondansetron, sodium citrate-citric acid  OBJECTIVE  BP (!) 139/92 (BP Location: Right Arm)   Pulse 70   Temp 99 F (37.2 C) (Oral)   Resp 18   Ht 5\' 3"  (1.6 m)   Wt 83.9 kg   LMP 05/11/2022 (Exact Date)   BMI 32.77 kg/m   General: NAD Heart: RRR, no murmur Lungs: CTAB Abdomen: soft, gravid, non-tender, normal bowel sounds DTRs: 2+  NST I reviewed the NST and it was reactive.  Baseline: 140  Variability: moderate Accelerations: present Decelerations:none Toco: no ctx Category 1   ASSESSMENT Impression  1) Pregnancy at G3P0110, [redacted]w[redacted]d, Estimated Date of Delivery: 02/15/23 2) Reassuring maternal/fetal status 3) Some elevated BPs with proteinuria 4) Anxiety - has taken Zoloft with good results in the past  PLAN  1) Consulted with Dr. Marcelline Mates. Will give betamethasone today and in 24 hours. Return to clinic on Monday for BP check and labs.  2) D/c home with strict return precautions after first dose of betamethasone. 3) Rx for Zoloft sent and instructions for proper administration given  Lloyd Huger, CNM 12/31/22  5:45 PM

## 2022-12-31 NOTE — Progress Notes (Signed)
MFM Note  Mia Williams was seen due to an IUGR fetus at 33 weeks and 3 days.  Her blood pressures were noted to be elevated starting last week.  Her blood pressures today were 156/100, 142/99, and 155/99.  The patient denies any signs or symptoms of preeclampsia.  A BPP performed today was 8/8.  The total AFI was 9.74 cm.  The fetus was in the vertex presentation.  Doppler studies of the umbilical arteries performed due to fetal growth restriction showed a normal S/D ratio of 3.25.  There were no signs of absent or reversed end-diastolic flow noted today.  The increased risk of preeclampsia due to an IUGR fetus and her elevated blood pressures was discussed.    Due to her elevated blood pressures, the patient was sent to labor and delivery for [facility] labs and for evaluation of preeclampsia.  Should preeclampsia be ruled out and her [place] labs are normal, she will return to our office in 1 week for another BPP and umbilical artery Doppler study.    Should preeclampsia be ruled out and her blood pressures remain elevated, due to IUGR and gestational hypertension, delivery may be considered at around 36 weeks.  Should her [place] labs be abnormal or should she be diagnosed with preeclampsia, she should receive a complete course of antenatal corticosteroids.  Inpatient management until delivery may be necessary.    Should she be diagnosed with preeclampsia, delivery may be considered at any time after 34 weeks.    Preeclampsia precautions were reviewed today.  The patient stated that all of her questions were answered today.  A total of 20 minutes was spent counseling and coordinating the care for this patient.  Greater than 50% of the time was spent in direct face-to-face contact.

## 2022-12-31 NOTE — OB Triage Note (Signed)
Pt presents from MFM for [facility] workup. Pt denies blurry vision, HA, or epigastric pain. Pt has +2 reflexes and no clonus. Pt denies LOf or bleeding. Pt reports positive fetal movement. Initial BP 139/92. BP cycling q15 mins. All other VSS. Will continue to monitor.

## 2022-12-31 NOTE — Progress Notes (Signed)
Pt had an MFM appointment today for BPP.  Pt's BP's were elevated during visit.  Dr. Annamaria Boots advised to have pt sent to [facility] for further testing.  Pt transported to [facility] via wheelchair with sig other.  Report given to Shon Millet, [place], CNM.  RN was in the pt room to assess pt before I Ieft [facility].

## 2022-12-31 NOTE — Discharge Instructions (Signed)
Please see your MyChart for work note. Call the office in the morning to make an appointment for repeat labs on Monday. Return to [facility] tomorrow Friday 01/01/23 around 7pm for your second dose of Betamethasone. Start taking your new prescription when you are able to pick it up from your pharmacy.

## 2023-01-01 ENCOUNTER — Telehealth: Payer: Self-pay

## 2023-01-01 ENCOUNTER — Encounter: Payer: Self-pay | Admitting: Advanced Practice Midwife

## 2023-01-01 ENCOUNTER — Encounter: Payer: Self-pay | Admitting: Obstetrics

## 2023-01-01 ENCOUNTER — Inpatient Hospital Stay
Admission: EM | Admit: 2023-01-01 | Discharge: 2023-01-04 | DRG: 788 | Disposition: A | Discharge status: Home / Self Care | Payer: BC Managed Care – PPO | Attending: Licensed Practical Nurse | Admitting: Licensed Practical Nurse

## 2023-01-01 DIAGNOSIS — R55 Syncope and collapse: Secondary | ICD-10-CM | POA: Diagnosis not present

## 2023-01-01 DIAGNOSIS — O99892 Other specified diseases and conditions complicating childbirth: Secondary | ICD-10-CM | POA: Diagnosis not present

## 2023-01-01 DIAGNOSIS — Z8279 Family history of other congenital malformations, deformations and chromosomal abnormalities: Secondary | ICD-10-CM

## 2023-01-01 DIAGNOSIS — O34211 Maternal care for low transverse scar from previous cesarean delivery: Secondary | ICD-10-CM | POA: Diagnosis present

## 2023-01-01 DIAGNOSIS — Z8759 Personal history of other complications of pregnancy, childbirth and the puerperium: Secondary | ICD-10-CM | POA: Diagnosis not present

## 2023-01-01 DIAGNOSIS — O09293 Supervision of pregnancy with other poor reproductive or obstetric history, third trimester: Secondary | ICD-10-CM | POA: Diagnosis not present

## 2023-01-01 DIAGNOSIS — O1414 Severe pre-eclampsia complicating childbirth: Principal | ICD-10-CM | POA: Diagnosis present

## 2023-01-01 DIAGNOSIS — O1493 Unspecified pre-eclampsia, third trimester: Secondary | ICD-10-CM | POA: Diagnosis present

## 2023-01-01 DIAGNOSIS — O163 Unspecified maternal hypertension, third trimester: Principal | ICD-10-CM | POA: Diagnosis present

## 2023-01-01 DIAGNOSIS — O34212 Maternal care for vertical scar from previous cesarean delivery: Secondary | ICD-10-CM | POA: Diagnosis not present

## 2023-01-01 DIAGNOSIS — Z3A33 33 weeks gestation of pregnancy: Secondary | ICD-10-CM

## 2023-01-01 DIAGNOSIS — O365930 Maternal care for other known or suspected poor fetal growth, third trimester, not applicable or unspecified: Secondary | ICD-10-CM | POA: Diagnosis present

## 2023-01-01 DIAGNOSIS — Z87738 Personal history of other specified (corrected) congenital malformations of digestive system: Secondary | ICD-10-CM | POA: Diagnosis not present

## 2023-01-01 DIAGNOSIS — R030 Elevated blood-pressure reading, without diagnosis of hypertension: Secondary | ICD-10-CM | POA: Diagnosis present

## 2023-01-01 DIAGNOSIS — Z98891 History of uterine scar from previous surgery: Secondary | ICD-10-CM

## 2023-01-01 LAB — CBC
HCT: 37.8 % (ref 36.0–46.0)
Hemoglobin: 12.5 g/dL (ref 12.0–15.0)
MCH: 30.9 pg (ref 26.0–34.0)
MCHC: 33.1 g/dL (ref 30.0–36.0)
MCV: 93.3 fL (ref 80.0–100.0)
Platelets: 199 10*3/uL (ref 150–400)
RBC: 4.05 MIL/uL (ref 3.87–5.11)
RDW: 15.1 % (ref 11.5–15.5)
WBC: 19.1 10*3/uL — ABNORMAL HIGH (ref 4.0–10.5)
nRBC: 0 % (ref 0.0–0.2)

## 2023-01-01 LAB — COMPREHENSIVE METABOLIC PANEL
ALT: 11 U/L (ref 0–44)
AST: 24 U/L (ref 15–41)
Albumin: 3.4 g/dL — ABNORMAL LOW (ref 3.5–5.0)
Alkaline Phosphatase: 100 U/L (ref 38–126)
Anion gap: 12 (ref 5–15)
BUN: 18 mg/dL (ref 6–20)
CO2: 18 mmol/L — ABNORMAL LOW (ref 22–32)
Calcium: 9.1 mg/dL (ref 8.9–10.3)
Chloride: 104 mmol/L (ref 98–111)
Creatinine, Ser: 0.71 mg/dL (ref 0.44–1.00)
GFR, Estimated: >60 mL/min (ref >60)
Glucose, Bld: 113 mg/dL — ABNORMAL HIGH (ref 70–99)
Potassium: 3.8 mmol/L (ref 3.5–5.1)
Sodium: 134 mmol/L — ABNORMAL LOW (ref 135–145)
Total Bilirubin: 0.5 mg/dL (ref 0.3–1.2)
Total Protein: 7.1 g/dL (ref 6.5–8.1)

## 2023-01-01 LAB — PROTEIN / CREATININE RATIO, URINE
Creatinine, Urine: 10 mg/dL
Protein Creatinine Ratio: 1.1 mg/mg{Cre} — ABNORMAL HIGH (ref 0.00–0.15)
Total Protein, Urine: 11 mg/dL

## 2023-01-01 LAB — TYPE AND SCREEN
ABO/RH(D): A POS
Antibody Screen: NEGATIVE

## 2023-01-01 MED ORDER — LACTATED RINGERS IV SOLN
INTRAVENOUS | Status: DC
  Administered 2023-01-02: 1000 mL via INTRAVENOUS

## 2023-01-01 MED ORDER — LABETALOL HCL 5 MG/ML IV SOLN: 80.0000 mg | INTRAVENOUS | Status: DC | PRN

## 2023-01-01 MED ORDER — MAGNESIUM SULFATE 40 GM/1000ML IV SOLN
2.0000 g/h | INTRAVENOUS | Status: DC
  Filled 2023-01-01: qty 1000

## 2023-01-01 MED ORDER — CALCIUM GLUCONATE 10 % IV SOLN
INTRAVENOUS | Status: AC
  Filled 2023-01-01: qty 10

## 2023-01-01 MED ORDER — ACETAMINOPHEN 500 MG PO TABS
1000.0000 mg | ORAL_TABLET | Freq: Four times a day (QID) | ORAL | Status: DC | PRN
  Administered 2023-01-02 – 2023-01-04 (×7): 1000 mg via ORAL
  Filled 2023-01-01 (×7): qty 2

## 2023-01-01 MED ORDER — LACTATED RINGERS IV BOLUS
500.0000 mL | Freq: Once | INTRAVENOUS | Status: AC
  Administered 2023-01-01: 500 mL via INTRAVENOUS

## 2023-01-01 MED ORDER — LACTATED RINGERS IV SOLN: INTRAVENOUS | Status: DC

## 2023-01-01 MED ORDER — LABETALOL HCL 5 MG/ML IV SOLN: 40.0000 mg | INTRAVENOUS | Status: DC | PRN

## 2023-01-01 MED ORDER — LABETALOL HCL 5 MG/ML IV SOLN
20.0000 mg | INTRAVENOUS | Status: DC | PRN
  Administered 2023-01-01: 20 mg via INTRAVENOUS

## 2023-01-01 MED ORDER — LABETALOL HCL 5 MG/ML IV SOLN
INTRAVENOUS | Status: AC
  Filled 2023-01-01: qty 4

## 2023-01-01 MED ORDER — MAGNESIUM SULFATE BOLUS VIA INFUSION
4.0000 g | Freq: Once | INTRAVENOUS | Status: AC
  Administered 2023-01-01: 4 g via INTRAVENOUS
  Filled 2023-01-01: qty 1000

## 2023-01-01 MED ORDER — ACETAMINOPHEN 500 MG PO TABS
ORAL_TABLET | ORAL | Status: AC
  Administered 2023-01-01: 1000 mg via ORAL
  Filled 2023-01-01: qty 2

## 2023-01-01 MED ORDER — BETAMETHASONE SOD PHOS & ACET 6 (3-3) MG/ML IJ SUSP
12.0000 mg | Freq: Once | INTRAMUSCULAR | Status: AC
  Administered 2023-01-01: 12 mg via INTRAMUSCULAR
  Filled 2023-01-01: qty 5

## 2023-01-01 MED ORDER — HYDRALAZINE HCL 20 MG/ML IJ SOLN: 10.0000 mg | INTRAMUSCULAR | Status: DC | PRN

## 2023-01-01 NOTE — OB Triage Note (Signed)
Pt presents to labor and delivery for elevated BPs. 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 Upon assessment, pt BP was 176/100 with a recheck of 190/100. Pt denies blurry vision, epigastric pain, or HA. Pt has brisk reflexes and 1 beat of clonus bilaterally which is a change from yesterdays assessment. Pt reports positive but minimal fetal movement. Pt denies bleeding or LOF. BP cycling q15 mins. ALL other VSS. Will continue to monitor.

## 2023-01-01 NOTE — Progress Notes (Signed)
This evening, I met with the patient and her husband to discuss cesarean delivery tomorrow morning.  Rationale for cesarean delivery in light of her worsening preeclampsia, now severe, and her IUGR infant was discussed with them.  I have explained the thought process is involved.  The balancing act between taking care of the mother and caring for the baby was discussed.  The rationale for 1 more day of steroids and use of magnesium were both discussed.  I have answered all of their questions and I believe they are satisfied that they understand my rationale and the necessity of delivery at this gestational age. Will plan for low-transverse cesarean delivery tomorrow if possible.  If size is limiting would consider vertical incision as previously performed.  I have discussed this with both of them and they understand this.

## 2023-01-01 NOTE — H&P (Signed)
OB History & Physical   History of Present Illness:  Chief Complaint: elevated blood pressure  HPI:  Mia Williams is a 37 y.o. G39P0110 female at [redacted]w[redacted]d dated by LMP.  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    She denies contractions.   She denies leakage of fluid.   She denies vaginal bleeding.   She reports fetal movement.    She reports mild headache while in triage. She denies visual changes or epigastric pain.   Of Note: birth comment from 2022 C[facility]section at [facility] mentions "vertical c/s scar" and also state LTCS. Unable to locate detailed Operative Note from [facility] chart. Surgery note on 05/24/21 mentions:  "MTCS [x]  Counsel re: MTCS and should not labor in future  However, also states Delivery Type: C-Section, Low Transverse"    Total weight gain for pregnancy: 11.3 kg   Obstetrical Problem List: third Problems (from 06/25/22 to present)         Nursing Staff Provider  Office Location  [facility] Dating  LMP consistent with early 06/27/22  Language  English Anatomy US  Normal   Flu Vaccine    Genetic Screen  NIPS: negative, female  TDaP vaccine    Hgb A1C or  GTT Early : Third trimester :   Covid     LAB RESULTS   Rhogam  N/A Blood Type A/Positive/-- (08/16 0911) A+  Feeding Plan both Antibody Negative (08/16 0911)neg  Contraception undecided Rubella 1.23 (08/16 0911)  Circumcision Yes, IP RPR Non Reactive (08/16 0911)   Pediatrician  List given HBsAg Negative (08/16 0911)   Support Person Angelo Caroll HIV Non Reactive (08/16 0911)  Prenatal Classes NA Varicella        GBS  (For PCN allergy, check sensitivities)   BTL Consent NA      VBAC Consent Needs repeat Pap            Component Value Date/Time     DIAGPAP   06/21/2019 0000      NEGATIVE FOR INTRAEPITHELIAL LESIONS OR MALIGNANCY.    ADEQPAP   06/21/2019 0000      Satisfactory for evaluation  endocervical/transformation zone component PRESENT.      High Risk HPV:       Positive   Adequacy:                        Satisfactory for evaluation, transformation zone component PRESENT   Diagnosis:                        Atypical squamous cells of undetermined significance (ASC-US)        Hgb Electro         CF        SMA                  Maternal Medical History:   Past Medical History:  Diagnosis Date   Family history of ovarian cancer    1/22 cancer genetic testing letter sent   History of normal Holter exam    11 YEARS AGO. TOLD HEART LAYS CLOSER TO STERNUM THAN USUAL SO FEELS HEART BEAT   Supervision of other high risk pregnancies, second trimester 12/13/2020    Nursing Staff  Provider  Office Location  [facility] Dating   LMP = 10 [place]  Language  English Anatomy US   marginal cord insertion - f/u at 24 wks EFW <0.5% - MFM referral placed - UA dop ordered Double bubble on anatomy   Flu Vaccine   Genetic Screen  NIPS:  declines  TDaP vaccine    Hgb A1C or  GTT Third trimester :   Rhogam   n/a   LAB RESULTS   Feeding Plan  breast Blood Type A/Positive/-- (01/07    Past Surgical History:  Procedure Laterality Date   cesarean section  05/24/2021   c/s   DILATION AND EVACUATION N/A 07/20/2019   Procedure: DILATATION AND EVACUATION;  Surgeon: Homero Fellers, MD;  Location: [facility] ORS;  Service: Gynecology;  Laterality: N/A;   FOREIGN BODY REMOVAL     FEET   TONSILLECTOMY      No Known Allergies  Prior to Admission medications   Medication Sig Start Date End Date Taking? Authorizing Provider  calcium carbonate (TUMS - DOSED IN MG ELEMENTAL CALCIUM) 500 MG chewable tablet Chew 1 tablet by mouth daily.    [provider]  Prenatal Vit-Fe Fumarate-FA (PRENATAL MULTIVITAMIN) TABS tablet Take 1 tablet by mouth daily at 12  noon.    [provider]  sertraline (ZOLOFT) 50 MG tablet Take 1 tablet (50 mg total) by mouth daily. Take half a tablet at bedtime for 7-10 days, then increase to 1 tablet at bedtime 12/31/22   Lurlean Horns, CNM    OB History  Gravida Para Term Preterm AB Living  3 1 0 1 1 0  SAB IAB Ectopic Multiple Live Births  1       1    # Outcome Date GA Lbr Len/2nd Weight Sex Delivery Anes PTL Lv  3 Current           2 Preterm 05/25/19   690 g F CS-LTranv  Y DEC     Birth Comments: has a vertical c/s scar  1 SAB 2020            Prenatal care site:  Ob Gyn  Social History: She  reports that she has never smoked. She has never used smokeless tobacco. She reports that she does not currently use alcohol. She reports that she does not use drugs.  Family History: family history includes Ovarian cancer in her paternal grandmother; Pancreatic cancer in her paternal grandfather.    Review of Systems:  Review of Systems  Constitutional:  Negative for chills and fever.  HENT:  Negative for congestion, ear discharge, ear pain, hearing loss, sinus pain and sore throat.   Eyes:  Negative for blurred vision and double vision.  Respiratory:  Negative for cough, shortness of breath and wheezing.   Cardiovascular:  Negative for chest pain, palpitations and leg swelling.  Gastrointestinal:  Negative for abdominal pain, blood in stool, constipation, diarrhea, heartburn, melena, nausea and vomiting.  Genitourinary:  Negative for dysuria, flank pain, frequency, hematuria and urgency.  Musculoskeletal:  Negative for back pain, joint pain and myalgias.  Skin:  Negative for itching and rash.  Neurological:  Positive for headaches. Negative for dizziness, tingling, tremors, sensory change, speech change, focal weakness, seizures, loss of consciousness and weakness.  Endo/Heme/Allergies:  Negative for environmental allergies. Does not bruise/bleed easily.  Psychiatric/Behavioral:  Negative  for depression, hallucinations, memory loss, substance abuse and suicidal ideas. The patient is not nervous/anxious and does not have insomnia.  Physical Exam:  BP (!) 148/83   Pulse (!) 102   Temp 98.1 F (36.7 C) (Oral)   Resp 18   Ht 5\' 3"  (1.6 m)   Wt 83.9 kg   LMP 05/11/2022 (Exact Date)   SpO2 92%   BMI 32.77 kg/m   Constitutional: Well nourished, well developed female in no acute distress.  HEENT: normal Skin: Warm and dry.  Cardiovascular: Regular rate and rhythm.   Extremity:  no edema   Respiratory: Clear to auscultation bilateral. Normal respiratory effort Abdomen: FHT present Back: no CVAT Neuro: DTRs 2+, Cranial nerves grossly intact Psych: Alert and Oriented x3. No memory deficits. Normal mood and affect.    Pelvic exam: deferred   Baseline FHR: 140 beats/min   Variability: moderate   Accelerations: present  10x10, Decelerations: absent Contractions: absent  Overall assessment: reassuring   Lab Results  Component Value Date   SARSCOV2NAA Not Detected 10/24/2019    Assessment:  Mia Williams is a 37 y.o. G26P0110 female at [redacted]w[redacted]d with severe preeclampsia.   Plan:  Admit to Labor & Delivery  CBC, T&S, IVF GBS unknown.   Fetal well-being: Category I Preeclampsia: Labetalol IV protocol, Magnesium Sulfate IV, Neonate consult NPO at midnight, C/Section 8 AM 1/27  S/P 2 doses betamethasone   Rod Can, CNM 01/01/2023 9:00 PM

## 2023-01-01 NOTE — Telephone Encounter (Signed)
Mia Williams called triage stating her blood pressures today we're high, like 170s/100s. I advised her to go to ED to be checked out.

## 2023-01-02 ENCOUNTER — Other Ambulatory Visit: Payer: Self-pay

## 2023-01-02 ENCOUNTER — Encounter
Admission: EM | Disposition: A | Discharge status: Home / Self Care | Payer: Self-pay | Source: Home / Self Care | Attending: Licensed Practical Nurse

## 2023-01-02 ENCOUNTER — Inpatient Hospital Stay: Payer: BC Managed Care – PPO | Admitting: Anesthesiology

## 2023-01-02 ENCOUNTER — Encounter: Payer: Self-pay | Admitting: Obstetrics and Gynecology

## 2023-01-02 DIAGNOSIS — O365930 Maternal care for other known or suspected poor fetal growth, third trimester, not applicable or unspecified: Secondary | ICD-10-CM

## 2023-01-02 DIAGNOSIS — O09293 Supervision of pregnancy with other poor reproductive or obstetric history, third trimester: Secondary | ICD-10-CM

## 2023-01-02 DIAGNOSIS — Z3A33 33 weeks gestation of pregnancy: Secondary | ICD-10-CM

## 2023-01-02 DIAGNOSIS — O1414 Severe pre-eclampsia complicating childbirth: Secondary | ICD-10-CM

## 2023-01-02 DIAGNOSIS — O34212 Maternal care for vertical scar from previous cesarean delivery: Secondary | ICD-10-CM

## 2023-01-02 LAB — ABO/RH: ABO/RH(D): A POS

## 2023-01-02 LAB — CBC
HCT: 35.8 % — ABNORMAL LOW (ref 36.0–46.0)
Hemoglobin: 12 g/dL (ref 12.0–15.0)
MCH: 30.8 pg (ref 26.0–34.0)
MCHC: 33.5 g/dL (ref 30.0–36.0)
MCV: 91.8 fL (ref 80.0–100.0)
Platelets: 201 10*3/uL (ref 150–400)
RBC: 3.9 MIL/uL (ref 3.87–5.11)
RDW: 15.5 % (ref 11.5–15.5)
WBC: 18.3 10*3/uL — ABNORMAL HIGH (ref 4.0–10.5)
nRBC: 0 % (ref 0.0–0.2)

## 2023-01-02 LAB — RPR: RPR Ser Ql: NONREACTIVE

## 2023-01-02 SURGERY — Surgical Case
Anesthesia: Spinal

## 2023-01-02 MED ORDER — ONDANSETRON HCL 4 MG/2ML IJ SOLN
INTRAMUSCULAR | Status: DC | PRN
  Administered 2023-01-02: 4 mg via INTRAVENOUS

## 2023-01-02 MED ORDER — BUPIVACAINE IN DEXTROSE 0.75-8.25 % IT SOLN
INTRATHECAL | Status: DC | PRN
  Administered 2023-01-02: 1.6 mL via INTRATHECAL

## 2023-01-02 MED ORDER — OXYTOCIN-SODIUM CHLORIDE 30-0.9 UT/500ML-% IV SOLN
INTRAVENOUS | Status: AC
  Filled 2023-01-02: qty 500

## 2023-01-02 MED ORDER — SOD CITRATE-CITRIC ACID 500-334 MG/5ML PO SOLN
ORAL | Status: AC
  Filled 2023-01-02: qty 15

## 2023-01-02 MED ORDER — HYDRALAZINE HCL 20 MG/ML IJ SOLN: 10.0000 mg | INTRAMUSCULAR | Status: DC | PRN

## 2023-01-02 MED ORDER — LABETALOL HCL 5 MG/ML IV SOLN: 20.0000 mg | INTRAVENOUS | Status: DC | PRN

## 2023-01-02 MED ORDER — SIMETHICONE 80 MG PO CHEW
80.0000 mg | CHEWABLE_TABLET | Freq: Four times a day (QID) | ORAL | Status: DC
  Administered 2023-01-02 – 2023-01-04 (×7): 80 mg via ORAL
  Filled 2023-01-02 (×7): qty 1

## 2023-01-02 MED ORDER — LACTATED RINGERS IV SOLN: INTRAVENOUS | Status: DC

## 2023-01-02 MED ORDER — MORPHINE SULFATE (PF) 0.5 MG/ML IJ SOLN
INTRAMUSCULAR | Status: AC
  Filled 2023-01-02: qty 10

## 2023-01-02 MED ORDER — IBUPROFEN 600 MG PO TABS
600.0000 mg | ORAL_TABLET | Freq: Four times a day (QID) | ORAL | Status: DC
  Administered 2023-01-02 – 2023-01-04 (×7): 600 mg via ORAL
  Filled 2023-01-02 (×7): qty 1

## 2023-01-02 MED ORDER — OXYTOCIN-SODIUM CHLORIDE 30-0.9 UT/500ML-% IV SOLN
INTRAVENOUS | Status: DC | PRN
  Administered 2023-01-02: 400 mL via INTRAVENOUS

## 2023-01-02 MED ORDER — ZOLPIDEM TARTRATE 5 MG PO TABS: 5.0000 mg | ORAL_TABLET | Freq: Every evening | ORAL | Status: DC | PRN

## 2023-01-02 MED ORDER — OXYCODONE-ACETAMINOPHEN 5-325 MG PO TABS
1.0000 | ORAL_TABLET | ORAL | Status: DC | PRN
  Administered 2023-01-02: 2 via ORAL
  Administered 2023-01-02: 1 via ORAL
  Filled 2023-01-02: qty 2
  Filled 2023-01-02: qty 1

## 2023-01-02 MED ORDER — DIPHENHYDRAMINE HCL 25 MG PO CAPS: 25.0000 mg | ORAL_CAPSULE | Freq: Four times a day (QID) | ORAL | Status: DC | PRN

## 2023-01-02 MED ORDER — ACETAMINOPHEN 500 MG PO TABS: 1000.0000 mg | ORAL_TABLET | ORAL | Status: DC

## 2023-01-02 MED ORDER — KETOROLAC TROMETHAMINE 30 MG/ML IJ SOLN: 30.0000 mg | Freq: Four times a day (QID) | INTRAMUSCULAR | Status: DC | PRN

## 2023-01-02 MED ORDER — MORPHINE SULFATE (PF) 0.5 MG/ML IJ SOLN
INTRAMUSCULAR | Status: DC | PRN
  Administered 2023-01-02: .1 mg via INTRATHECAL

## 2023-01-02 MED ORDER — PROPOFOL 10 MG/ML IV BOLUS
INTRAVENOUS | Status: AC
  Filled 2023-01-02: qty 20

## 2023-01-02 MED ORDER — ONDANSETRON HCL 4 MG/2ML IJ SOLN: 4.0000 mg | Freq: Three times a day (TID) | INTRAMUSCULAR | Status: DC | PRN

## 2023-01-02 MED ORDER — OXYCODONE HCL 5 MG PO TABS
5.0000 mg | ORAL_TABLET | Freq: Four times a day (QID) | ORAL | Status: DC | PRN
  Administered 2023-01-03 – 2023-01-04 (×4): 5 mg via ORAL
  Filled 2023-01-02 (×4): qty 1

## 2023-01-02 MED ORDER — DIPHENHYDRAMINE HCL 25 MG PO CAPS: 25.0000 mg | ORAL_CAPSULE | ORAL | Status: DC | PRN

## 2023-01-02 MED ORDER — SENNOSIDES-DOCUSATE SODIUM 8.6-50 MG PO TABS
2.0000 | ORAL_TABLET | ORAL | Status: DC
  Administered 2023-01-02 – 2023-01-04 (×3): 2 via ORAL
  Filled 2023-01-02 (×3): qty 2

## 2023-01-02 MED ORDER — LIDOCAINE 5 % EX PTCH
1.0000 | MEDICATED_PATCH | CUTANEOUS | Status: DC
  Filled 2023-01-02: qty 1

## 2023-01-02 MED ORDER — MENTHOL 3 MG MT LOZG: 1.0000 | LOZENGE | OROMUCOSAL | Status: DC | PRN

## 2023-01-02 MED ORDER — GABAPENTIN 300 MG PO CAPS: 300.0000 mg | ORAL_CAPSULE | ORAL | Status: DC

## 2023-01-02 MED ORDER — FENTANYL CITRATE (PF) 100 MCG/2ML IJ SOLN
INTRAMUSCULAR | Status: AC
  Filled 2023-01-02: qty 2

## 2023-01-02 MED ORDER — SODIUM CHLORIDE 0.9% FLUSH: 3.0000 mL | INTRAVENOUS | Status: DC | PRN

## 2023-01-02 MED ORDER — 0.9 % SODIUM CHLORIDE (POUR BTL) OPTIME
TOPICAL | Status: DC | PRN
  Administered 2023-01-02: 1000 mL

## 2023-01-02 MED ORDER — SCOPOLAMINE 1 MG/3DAYS TD PT72: 1.0000 | MEDICATED_PATCH | Freq: Once | TRANSDERMAL | Status: DC

## 2023-01-02 MED ORDER — LABETALOL HCL 5 MG/ML IV SOLN: 40.0000 mg | INTRAVENOUS | Status: DC | PRN

## 2023-01-02 MED ORDER — NALOXONE HCL 4 MG/10ML IJ SOLN: 1.0000 ug/kg/h | INTRAVENOUS | Status: DC | PRN

## 2023-01-02 MED ORDER — OXYTOCIN-SODIUM CHLORIDE 30-0.9 UT/500ML-% IV SOLN
2.5000 [IU]/h | INTRAVENOUS | Status: AC
  Administered 2023-01-02: 2.5 [IU]/h via INTRAVENOUS
  Filled 2023-01-02: qty 500

## 2023-01-02 MED ORDER — PHENYLEPHRINE HCL (PRESSORS) 10 MG/ML IV SOLN
INTRAVENOUS | Status: DC | PRN
  Administered 2023-01-02: 80 ug via INTRAVENOUS

## 2023-01-02 MED ORDER — NALOXONE HCL 0.4 MG/ML IJ SOLN: 0.4000 mg | INTRAMUSCULAR | Status: DC | PRN

## 2023-01-02 MED ORDER — PHENYLEPHRINE HCL-NACL 20-0.9 MG/250ML-% IV SOLN
INTRAVENOUS | Status: DC | PRN
  Administered 2023-01-02: 50 ug/min via INTRAVENOUS

## 2023-01-02 MED ORDER — PRENATAL MULTIVITAMIN CH
1.0000 | ORAL_TABLET | Freq: Every day | ORAL | Status: DC
  Administered 2023-01-02 – 2023-01-04 (×3): 1 via ORAL
  Filled 2023-01-02 (×3): qty 1

## 2023-01-02 MED ORDER — SOD CITRATE-CITRIC ACID 500-334 MG/5ML PO SOLN
30.0000 mL | ORAL | Status: AC
  Administered 2023-01-02: 30 mL via ORAL

## 2023-01-02 MED ORDER — LABETALOL HCL 5 MG/ML IV SOLN: 80.0000 mg | INTRAVENOUS | Status: DC | PRN

## 2023-01-02 MED ORDER — DIPHENHYDRAMINE HCL 50 MG/ML IJ SOLN: 12.5000 mg | INTRAMUSCULAR | Status: DC | PRN

## 2023-01-02 MED ORDER — POVIDONE-IODINE 10 % EX SWAB: 2.0000 | Freq: Once | CUTANEOUS | Status: DC

## 2023-01-02 MED ORDER — ONDANSETRON HCL 4 MG/2ML IJ SOLN
INTRAMUSCULAR | Status: AC
  Filled 2023-01-02: qty 2

## 2023-01-02 MED ORDER — CEFAZOLIN SODIUM-DEXTROSE 2-4 GM/100ML-% IV SOLN
2.0000 g | INTRAVENOUS | Status: AC
  Administered 2023-01-02: 2 g via INTRAVENOUS
  Filled 2023-01-02: qty 100

## 2023-01-02 MED ORDER — FENTANYL CITRATE (PF) 100 MCG/2ML IJ SOLN
INTRAMUSCULAR | Status: DC | PRN
  Administered 2023-01-02: 15 ug via INTRATHECAL

## 2023-01-02 MED ORDER — MAGNESIUM SULFATE 40 GM/1000ML IV SOLN
2.0000 g/h | INTRAVENOUS | Status: DC
  Administered 2023-01-02 (×2): 2 g/h via INTRAVENOUS
  Filled 2023-01-02: qty 1000

## 2023-01-02 SURGICAL SUPPLY — 33 items
BAG COUNTER SPONGE SURGICOUNT (BAG) ×1 IMPLANT
BENZOIN TINCTURE PRP APPL 2/3 (GAUZE/BANDAGES/DRESSINGS) IMPLANT
BNDG TENSOPLAST 6X5 (GAUZE/BANDAGES/DRESSINGS) IMPLANT
CHLORAPREP W/TINT 26 (MISCELLANEOUS) ×2 IMPLANT
CLSR STERI-STRIP ANTIMIC 1/2X4 (GAUZE/BANDAGES/DRESSINGS) IMPLANT
DRSG TELFA 3X8 NADH STRL (GAUZE/BANDAGES/DRESSINGS) ×1 IMPLANT
ELECT REM PT RETURN 9FT ADLT (ELECTROSURGICAL) ×1
ELECTRODE REM PT RTRN 9FT ADLT (ELECTROSURGICAL) ×1 IMPLANT
EXTRT SYSTEM ALEXIS 17CM (MISCELLANEOUS)
GAUZE SPONGE 4X4 12PLY STRL (GAUZE/BANDAGES/DRESSINGS) ×1 IMPLANT
GLOVE BIO SURGEON STRL SZ 6.5 (GLOVE) ×1 IMPLANT
GLOVE SURG UNDER LTX SZ7 (GLOVE) ×1 IMPLANT
GOWN STRL REUS W/ TWL LRG LVL3 (GOWN DISPOSABLE) ×2 IMPLANT
GOWN STRL REUS W/TWL LRG LVL3 (GOWN DISPOSABLE) ×2
KIT TURNOVER KIT A (KITS) ×1 IMPLANT
MANIFOLD NEPTUNE II (INSTRUMENTS) ×1 IMPLANT
MAT PREVALON FULL STRYKER (MISCELLANEOUS) ×1 IMPLANT
NS IRRIG 1000ML POUR BTL (IV SOLUTION) ×1 IMPLANT
PACK C SECTION AR (MISCELLANEOUS) ×1 IMPLANT
PAD ABD DERMACEA PRESS 5X9 (GAUZE/BANDAGES/DRESSINGS) IMPLANT
PAD OB MATERNITY 4.3X12.25 (PERSONAL CARE ITEMS) ×1 IMPLANT
PAD PREP 24X41 OB/GYN DISP (PERSONAL CARE ITEMS) ×1 IMPLANT
RETRACTOR WOUND ALXS 18CM MED (MISCELLANEOUS) IMPLANT
RTRCTR WOUND ALEXIS O 18CM MED (MISCELLANEOUS) ×1
SCRUB CHG 4% DYNA-HEX 4OZ (MISCELLANEOUS) ×1 IMPLANT
SUT MNCRL AB 4-0 PS2 18 (SUTURE) ×1 IMPLANT
SUT PLAIN 2 0 XLH (SUTURE) IMPLANT
SUT VIC AB 0 CT1 36 (SUTURE) ×4 IMPLANT
SUT VIC AB 3-0 SH 27 (SUTURE) ×1
SUT VIC AB 3-0 SH 27X BRD (SUTURE) ×1 IMPLANT
SYSTEM CONTND EXTRCTN KII BLLN (MISCELLANEOUS) IMPLANT
TRAP FLUID SMOKE EVACUATOR (MISCELLANEOUS) ×1 IMPLANT
WATER STERILE IRR 500ML POUR (IV SOLUTION) ×1 IMPLANT

## 2023-01-02 NOTE — Progress Notes (Signed)
CSW called patient about her medicaid questions. Patient did not answer. CSW will attempt to reach out to patient when she gets into a room or tomorrow when she is settled.

## 2023-01-02 NOTE — Transfer of Care (Signed)
Immediate Anesthesia Transfer of Care Note  Patient: Mia Williams  Procedure(s) Performed: REPEAT CESAREAN SECTION  Patient Location: PACU and Nursing Unit  Anesthesia Type:Spinal  Level of Consciousness: awake, alert , and oriented  Airway & Oxygen Therapy: Patient Spontanous Breathing and Patient connected to nasal cannula oxygen  Post-op Assessment: Report given to RN and Post -op Vital signs reviewed and stable  Post vital signs: Reviewed and stable  Last Vitals:  Vitals Value Taken Time  BP    Temp    Pulse    Resp    SpO2      Last Pain:  Vitals:   01/02/23 0715  TempSrc: Oral  PainSc:          Complications: No notable events documented.

## 2023-01-02 NOTE — Lactation Note (Signed)
This note was copied from a baby's chart. Lactation Consultation Note  Patient Name: Mia Williams [id number] Date: 01/02/2023 Reason for consult: L&D Initial assessment;Preterm <34wks;Other (Comment) (to initiate pumping) Age:36 hours  Maternal Data Does the patient have breastfeeding experience prior to this delivery?: Yes How long did the patient breastfeed?: pumped and bottlefed preterm baby with anomalies Offered to assist with initiation of electric breast pump as baby is preterm and in SCN, mom declines at this time as she is not feeling well post c/s and on MgSO4 IV  Feeding Mother's Current Feeding Choice: Breast Milk  LATCH Score                    Lactation Tools Discussed/Used Tools: Pump Breast pump type: Double-Electric Breast Pump Pump Education:  (mom declines to pump at this time, she is post c/s and not feelng well) Reason for Pumping: to provide EBM for baby preterm in SCN  Interventions  DEBP set up in LDR 5   Discharge Pump: Personal;DEBP WIC Program: No  Consult Status Consult Status: Follow-up Date: 01/02/23 Follow-up type: In-patient    Ferol Luz 01/02/2023, 12:18 PM

## 2023-01-02 NOTE — Lactation Note (Signed)
This note was copied from a baby's chart. Lactation Consultation Note  Patient Name: Mia Williams [id number] Date: 01/02/2023 Reason for consult: Follow-up assessment;NICU baby;Preterm <34wks;Infant < 6lbs Age:36 hours  Maternal Data  Mom assisted with first pumping session, pumped in initiation/Premie mode, obtained 5 cc, this was labeled and taken to SCN and given to Katie, SCN nurse, breast milk labels and breastmilk containers left in mom's room in LDR 5, pump pieces cleaned.  Feeding Mother's Current Feeding Choice: Breast Milk  LATCH Score                    Lactation Tools Discussed/Used Breast pump type: Double-Electric Breast Pump Pump Education: Setup, frequency, and cleaning;Milk Storage Reason for Pumping: BAby in SCN Pumping frequency: q3h Pumped volume: 5 mL  Interventions    Discharge    Consult Status Consult Status: Follow-up from L&D Date: 01/03/23 Follow-up type: In-patient    Ferol Luz 01/02/2023, 4:23 PM

## 2023-01-02 NOTE — Anesthesia Preprocedure Evaluation (Signed)
Anesthesia Evaluation  Patient identified by MRN, date of birth, ID band Patient awake    Reviewed: Allergy & Precautions, NPO status , Patient's Chart, lab work & pertinent test results  History of Anesthesia Complications Negative for: history of anesthetic complications  Airway Mallampati: III  TM Distance: >3 FB Neck ROM: full    Dental no notable dental hx.    Pulmonary neg pulmonary ROS   Pulmonary exam normal        Cardiovascular Exercise Tolerance: Good negative cardio ROS Normal cardiovascular exam     Neuro/Psych  PSYCHIATRIC DISORDERS Anxiety        GI/Hepatic negative GI ROS,,,  Endo/Other  negative endocrine ROS    Renal/GU negative Renal ROS  negative genitourinary   Musculoskeletal   Abdominal   Peds  Hematology negative hematology ROS (+)   Anesthesia Other Findings Past Medical History: No date: Family history of ovarian cancer     Comment:  1/22 cancer genetic testing letter sent No date: History of normal Holter exam     Comment:  11 YEARS AGO. TOLD HEART LAYS CLOSER TO STERNUM THAN               USUAL SO FEELS HEART BEAT 12/13/2020: Supervision of other high risk pregnancies, second trimester     Comment:   Nursing Staff Provider  Office Location  [facility]               Dating   LMP = 10 [place]  Language  English Anatomy US                 marginal cord insertion - f/u at 24 wks EFW <0.5% - MFM               referral placed - UA dop ordered Double bubble on anatomy              Flu Vaccine   Genetic Screen  NIPS:  declines  TDaP               vaccine    Hgb A1C or  GTT Third trimester :   Rhogam                 n/a   LAB RESULTS   Feeding Plan  breast Blood Type               A/Positive/-- (01/07  Past Surgical History: 05/24/2021: cesarean section     Comment:  c/s 07/20/2019: DILATION AND EVACUATION; N/A     Comment:  Procedure: DILATATION AND EVACUATION;  Surgeon: Homero Fellers, MD;  Location: [facility] ORS;  Service:               Gynecology;  Laterality: N/A; No date: FOREIGN BODY REMOVAL     Comment:  FEET No date: TONSILLECTOMY  BMI    Body Mass Index: 32.77 kg/m      Reproductive/Obstetrics (+) Pregnancy Severe Pre-E on Mag                               Anesthesia Physical Anesthesia Plan  ASA: 3  Anesthesia Plan: Spinal   Post-op Pain Management:    Induction:   PONV Risk Score and Plan:   Airway Management Planned: Natural Airway and Nasal Cannula  Additional Equipment:   Intra-op Plan:  Post-operative Plan:   Informed Consent: I have reviewed the patients History and Physical, chart, labs and discussed the procedure including the risks, benefits and alternatives for the proposed anesthesia with the patient or authorized representative who has indicated his/her understanding and acceptance.     Dental Advisory Given  Plan Discussed with: Anesthesiologist  Anesthesia Plan Comments: (Patient reports no bleeding problems and no anticoagulant use.  Plan for spinal with backup GA  Patient consented for risks of anesthesia including but not limited to:  - adverse reactions to medications - damage to eyes, teeth, lips or other oral mucosa - nerve damage due to positioning  - risk of bleeding, infection and or nerve damage from spinal that could lead to paralysis - risk of headache or failed spinal - damage to teeth, lips or other oral mucosa - sore throat or hoarseness - damage to heart, brain, nerves, lungs, other parts of body or loss of life  Patient voiced understanding.)         Anesthesia Quick Evaluation

## 2023-01-02 NOTE — Anesthesia Procedure Notes (Addendum)
Date/Time: 01/02/2023 8:27 AM  Performed by: Demetrius Charity, CRNAPre-anesthesia Checklist: Patient identified, Emergency Drugs available, Suction available, Patient being monitored and Timeout performed Patient Re-evaluated:Patient Re-evaluated prior to induction Oxygen Delivery Method: Nasal cannula Induction Type: IV induction Placement Confirmation: CO2 detector and positive ETCO2

## 2023-01-02 NOTE — Progress Notes (Signed)
Pt sat at side of bed, marched in place, and then transferred with assistance to bathroom. Pt sat on toilet while RN assisted with hygiene. Pt started feeling clammy, nauseas, and hot. RN called for assistance and waved ammonia. Pt passed out twice as we used the Stedy to get the pt back to the bed. First BP once pt was back in bed was 134/74, O2 98%, pulse 68.

## 2023-01-02 NOTE — Op Note (Signed)
OP NOTE  Date: 01/02/2023   9:37 AM Name Mia Williams MR# [id number]  Preoperative Diagnosis: 1. Intrauterine pregnancy at [redacted]w[redacted]d Principal Problem:   Elevated blood pressure affecting pregnancy in third trimester, antepartum Active Problems:   Preeclampsia, third trimester  2.  Previous vertical uterine incision 3.  FGR infant  Postoperative Diagnosis: 1. Intrauterine pregnancy at [redacted]w[redacted]d, delivered 2. Viable infant 3. Remainder same as pre-op   Procedure: 1. Repeat Low-Transverse Cesarean Section  Surgeon: Finis Bud, MD  Assistant:  Dominic CNM  No other capable assistant was available for this surgery which requires an experienced, high level assistant.   Anesthesia: Spinal    EBL: 650 mL    Findings: 1) female infant, Apgar scores of    at 1 minute and    at 5 minutes and a birthweight of 57.5  ounces.    2) Normal uterus, tubes and ovaries.    Procedure:  The patient was prepped and draped in the supine position and placed under spinal anesthesia.  A transverse incision was made across the abdomen in a Pfannenstiel manner. If indicated the old scar was systematically removed with sharp dissection.  We carried the dissection down to the level of the fascia.  The fascia was incised in a curvilinear manner.  The fascia was then elevated from the rectus muscles with blunt and sharp dissection.  The rectus muscles were separated laterally exposing the peritoneum.  The peritoneum was carefully entered with care being taken to avoid bowel and bladder.  A self-retaining retractor was placed.  The visceral peritoneum was incised in a curvilinear fashion across the lower uterine segment creating a bladder flap. A transverse incision was made across the lower uterine segment and extended laterally and superiorly using the bandage scissors.  Artificial rupture membranes was performed and Clear fluid was noted.  The infant was delivered from the cephalic position.  A nuchal  cord was present. After an appropriate time interval, the cord was doubly clamped and cut. Cord blood was obtained if required.  The infant was handed to the pediatric personnel  who then placed the infant under heat lamps where it was cleaned dried and suctioned as needed. The placenta was delivered. The hysterotomy incision was then identified on ring forceps.  The uterine cavity was cleaned with a moist lap sponge.  The hysterotomy incision was closed with a running interlocking suture of Vicryl.  Hemostasis was excellent.  Pitocin was run in the IV and the uterus was found to be firm. The posterior cul-de-sac and gutters were cleaned and inspected.  Hemostasis was noted.  The fascia was then closed with a running suture of #1 Vicryl.  Hemostasis of the subcutaneous tissues was obtained using the Bovie.  The subcutaneous tissues were closed with a running suture of 000 Vicryl.  A subcuticular suture was placed.  Steri-strips were applied in the usual manner.  A Lidoderm patch was applied.  A pressure dressing was placed.  The patient went to the recovery room in stable condition.   Finis Bud, M.D. 01/02/2023 9:37 AM

## 2023-01-02 NOTE — Anesthesia Procedure Notes (Addendum)
Spinal  Patient location during procedure: OR Start time: 01/02/2023 8:16 AM End time: 01/02/2023 8:18 AM Reason for block: surgical anesthesia Staffing Performed: resident/CRNA  Resident/CRNA: Demetrius Charity, CRNA Performed by: Demetrius Charity, CRNA Authorized by: Ilene Qua, MD   Preanesthetic Checklist Completed: patient identified, IV checked, site marked, risks and benefits discussed, surgical consent, monitors and equipment checked, pre-op evaluation and timeout performed Spinal Block Patient position: sitting Prep: ChloraPrep Patient monitoring: heart rate, continuous pulse ox, blood pressure and cardiac monitor Approach: midline Location: L3-4 Injection technique: single-shot Needle Needle type: Whitacre and Introducer  Needle gauge: 25 G Needle length: 9 cm Assessment Sensory level: T4 Events: CSF return Additional Notes Sterile aseptic technique used throughout the procedure.  Negative paresthesia. Negative blood return. Positive free-flowing CSF. Expiration date of kit checked and confirmed. Patient tolerated procedure well, without complications.

## 2023-01-02 NOTE — Progress Notes (Signed)
Called to room as pt "passed out" while in the BR. Per Casey Burkitt first dangled for a few minutes over the side of  the bed, then got up to the BR, while on toilet she became clammy and pale. Additional personnel in the room assisted Kory back to the bed.  OBJECTIVE: BP (!) 142/74   Pulse 97   Temp 98.6 F (37 C) (Oral)   Resp 20   Ht 5\' 3"  (1.6 m)   Wt 83.9 kg   LMP 05/11/2022 (Exact Date)   SpO2 97%   Breastfeeding Unknown   BMI 32.77 kg/m   Pt back in bed, Alert and oriented  Skin pale and little clammy Lungs: CTAB Heart: RRR, no murmurs, skips or gallops FF M U/2 Bleeding small amount   ASSESSMENT: Syncopal episode   PLAN: Pt to remain in bed for anther few hours then try to dangle at side of bed. If able to tolerate consider transfer to sire up to chair prior to transferring to [facility] to see infant in SCN.    Roberto Scales, [facility]  01/02/2023  4:57 PM

## 2023-01-03 LAB — CBC
HCT: 29.6 % — ABNORMAL LOW (ref 36.0–46.0)
Hemoglobin: 9.6 g/dL — ABNORMAL LOW (ref 12.0–15.0)
MCH: 30.1 pg (ref 26.0–34.0)
MCHC: 32.4 g/dL (ref 30.0–36.0)
MCV: 92.8 fL (ref 80.0–100.0)
Platelets: 168 10*3/uL (ref 150–400)
RBC: 3.19 MIL/uL — ABNORMAL LOW (ref 3.87–5.11)
RDW: 15.9 % — ABNORMAL HIGH (ref 11.5–15.5)
WBC: 18.2 10*3/uL — ABNORMAL HIGH (ref 4.0–10.5)
nRBC: 0 % (ref 0.0–0.2)

## 2023-01-03 MED ORDER — WHITE PETROLATUM EX OINT
TOPICAL_OINTMENT | CUTANEOUS | Status: DC | PRN
  Filled 2023-01-03: qty 28.35

## 2023-01-03 NOTE — Anesthesia Postprocedure Evaluation (Signed)
Anesthesia Post Note  Patient: Mia Williams  Procedure(s) Performed: [place]  Patient location during evaluation: Mother Baby Anesthesia Type: Spinal Level of consciousness: oriented and awake and alert Pain management: pain level controlled Vital Signs Assessment: post-procedure vital signs reviewed and stable Respiratory status: spontaneous breathing and respiratory function stable Cardiovascular status: blood pressure returned to baseline and stable Postop Assessment: no headache, no backache, no apparent nausea or vomiting and able to ambulate Anesthetic complications: no   No notable events documented.   Last Vitals:  Vitals:   01/03/23 1005 01/03/23 1222  BP: 139/84 (!) 144/80  Pulse: 75 90  Resp:  16  Temp:  37.2 C  SpO2:  96%    Last Pain:  Vitals:   01/03/23 1222  TempSrc: Oral  PainSc:                  Precious Haws Celene Pippins

## 2023-01-03 NOTE — Lactation Note (Addendum)
This note was copied from a baby's chart. Lactation Consultation Note  Patient Name: Mia Williams [id number] Date: 01/03/2023 Reason for consult: Follow-up assessment;Preterm <34wks;Infant < 6lbs;NICU baby Age:36 hours  Maternal Data This is mom's 2nd baby, repeat C/S (mom with preeclampsia and baby with severe IUGR(nl Doppler) per chart review). Baby is 33 6/7 weeks, SGA,and in SCN. Mom with history of AMA, anxiety and depression(on zoloft). Her previous child born at 62 weeks with duodenal atresia and trisomy 21 died at 44 months of age in the NICU at [place] on 05/2021.  Does the patient have breastfeeding experience prior to this delivery?: No  Feeding Mother's Current Feeding Choice: Breast Milk   Lactation Tools Discussed/Used Tools: Pump Breast pump type: Double-Electric Breast Pump Pump Education: Other (comment) (Mother has been pumping, uderstands set-up, and milk storage guidelines.) Reason for Pumping: to provide EBM for baby preterm in SCN Pumping frequency: goal is 8 times in 24 hours. Pumped volume:  (per mom she is able to express small amounts of colostrum at each pump session)  Interventions Interventions: Education  Discharge Pump: Personal (double electric(medela), double electric motif)  Consult Status Consult Status: Follow-up Date: 01/04/23 Follow-up type: In-patient  Update provided to care nurse.  Jonna Teryl Mcconaghy 01/03/2023, 5:10 PM

## 2023-01-03 NOTE — Anesthesia Post-op Follow-up Note (Signed)
  Anesthesia Pain Follow-up Note  Patient: Mia Williams  Day #: 1  Date of Follow-up: 01/03/2023 Time: 12:29 PM  Last Vitals:  Vitals:   01/03/23 1005 01/03/23 1222  BP: 139/84 (!) 144/80  Pulse: 75 90  Resp:  16  Temp:  37.2 C  SpO2:  96%    Level of Consciousness: alert  Pain: mild   Side Effects:None  Catheter Site Exam:clean, dry, no drainage     Plan: D/C Infusion at surgeon's request  [place]

## 2023-01-03 NOTE — Progress Notes (Signed)
CSW spoke with patient about her medicaid questions. CSW advised patient to contact social services tomorrow about setting up her sons medicaid and also seeing if she needs to fill anything out in regards to SSI. Patient agreed.

## 2023-01-03 NOTE — Progress Notes (Signed)
Benedetta ambulated to the bathroom. Foley removed. Bed linen and gown changed. Independently brushed teeth. She ambulated to [facility] with contact guard by RN. 2nd RN followed with wheelchair. Family bonding in SCN. Returned to [facility] via wheelchair after 30 minutes at infant Holden's bedside with husband. She tolerated the activity well.

## 2023-01-03 NOTE — Progress Notes (Signed)
Progress Note - Cesarean Delivery  Mia Williams is a 37 y.o. [phone] now PP day 1 s/p C-Section, Low Transverse .   Subjective:  Patient reports no problems with eating, bowel movements, voiding, or their wound  Pain controlled  Baby in Special care doing great!!  Objective:  Vital signs in last 24 hours: Temp:  [97.9 F (36.6 C)-98.6 F (37 C)] 98.2 F (36.8 C) (01/28 0734) Pulse Rate:  [68-97] 75 (01/28 1005) Resp:  [14-24] 14 (01/28 0734) BP: (111-149)/(58-88) 139/84 (01/28 1005) SpO2:  [92 %-100 %] 99 % (01/28 0230)  Physical Exam:  General: alert, cooperative, and no distress Lochia: appropriate Uterine Fundus: firm Incision: Dressing intact    Data Review Recent Labs    01/02/23 0633 01/03/23 0554  HGB 12.0 9.6*  HCT 35.8* 29.6*    Assessment:  Principal Problem:   Elevated blood pressure affecting pregnancy in third trimester, antepartum Active Problems:   Preeclampsia, third trimester   Status post Cesarean section. Doing well postoperatively.   Bps good  Plan:       Continue current care.  Change dressing today  Pt may shower - OOB  Finis Bud, M.D. 01/03/2023 11:30 AM

## 2023-01-04 ENCOUNTER — Encounter: Payer: Self-pay | Admitting: Obstetrics and Gynecology

## 2023-01-04 MED ORDER — FERROUS SULFATE 325 (65 FE) MG PO TABS: 325.0000 mg | ORAL_TABLET | Freq: Three times a day (TID) | ORAL | Status: DC

## 2023-01-04 MED ORDER — OXYCODONE HCL 5 MG PO TABS: 5.0000 mg | ORAL_TABLET | Freq: Four times a day (QID) | ORAL | 0 refills | Status: DC | PRN

## 2023-01-04 MED ORDER — LABETALOL HCL 100 MG PO TABS: 100.0000 mg | ORAL_TABLET | Freq: Two times a day (BID) | ORAL | Status: DC

## 2023-01-04 MED ORDER — LABETALOL HCL 100 MG PO TABS: 100.0000 mg | ORAL_TABLET | Freq: Two times a day (BID) | ORAL | 1 refills | Status: DC

## 2023-01-04 MED ORDER — IBUPROFEN 600 MG PO TABS: 600.0000 mg | ORAL_TABLET | Freq: Four times a day (QID) | ORAL | 0 refills | Status: DC

## 2023-01-04 NOTE — Discharge Summary (Signed)
Postpartum Discharge Summary  Date of Service updated 01/04/2023     Patient Name: Mia Williams DOB: 02-02-1986 MRN: [id number]  Date of admission: 01/01/2023 Delivery date:01/02/2023  Delivering provider: Harlin Heys  Date of discharge: 01/04/2023  Admitting diagnosis: Elevated blood pressure affecting pregnancy in third trimester, antepartum [O16.3] Preeclampsia, third trimester [O14.93] Intrauterine pregnancy: [redacted]w[redacted]d     Secondary diagnosis:  Principal Problem:   Elevated blood pressure affecting pregnancy in third trimester, antepartum Active Problems:   Preeclampsia, third trimester  Additional problems: none- magnesium sulphate post delivery for 24 hours    Discharge diagnosis: Preeclampsia (severe)                                              Post partum procedures: none Augmentation: N/A Complications: None  Hospital course: Sceduled C/S   37 y.o. yo G3P0211 at [redacted]w[redacted]d was admitted to the hospital 01/01/2023 for scheduled cesarean section with the following indication:Prior Uterine Surgery.Delivery details are as follows:  Membrane Rupture Time/Date: 8:49 AM ,01/02/2023   Delivery Method:C-Section, Low Transverse  Details of operation can be found in separate operative note.  Patient had a postpartum course complicated bya need for PP magnesium sulphate due to her Dx of pre eclampsia..  She is ambulating, tolerating a regular diet, passing flatus, and urinating well. Her blood pressure is being controlled with oral Labetalol. Patient is discharged home in stable condition on  01/04/23        Newborn Data: Birth date:01/02/2023  Birth time:8:51 AM  Gender:Female  Living status:Living  Apgars:8 ,9  Weight:1630 g     Magnesium Sulfate received: Yes: Seizure prophylaxis BMZ received: Yes Rhophylac:N/A MMR:No T-DaP:Given prenatally Flu: No Transfusion:No  Physical exam  Vitals:   01/03/23 2009 01/03/23 2300 01/04/23 0446 01/04/23 0836  BP: (!) 143/71 131/74 (!)  148/72 136/77  Pulse: 82 65 73 70  Resp: 20 18 20 19   Temp: 98.2 F (36.8 C) 98.7 F (37.1 C) 99 F (37.2 C) 98.9 F (37.2 C)  TempSrc: Oral Oral Oral Oral  SpO2:   98% 98%  Weight:      Height:       General: alert, cooperative, and no distress no headache or visual disturbance Lochia: appropriate Uterine Fundus: firm Incision: Healing well with no significant drainage, No significant erythema, Dressing is clean, dry, and intact DVT Evaluation: No evidence of DVT seen on physical exam. Negative Homan's sign. No cords or calf tenderness. Labs: Lab Results  Component Value Date   WBC 18.2 (H) 01/03/2023   HGB 9.6 (L) 01/03/2023   HCT 29.6 (L) 01/03/2023   MCV 92.8 01/03/2023   PLT 168 01/03/2023      Latest Ref Rng & Units 01/01/2023    3:51 PM  CMP  Glucose 70 - 99 mg/dL 113   BUN 6 - 20 mg/dL 18   Creatinine 0.44 - 1.00 mg/dL 0.71   Sodium 135 - 145 mmol/L 134   Potassium 3.5 - 5.1 mmol/L 3.8   Chloride 98 - 111 mmol/L 104   CO2 22 - 32 mmol/L 18   Calcium 8.9 - 10.3 mg/dL 9.1   Total Protein 6.5 - 8.1 g/dL 7.1   Total Bilirubin 0.3 - 1.2 mg/dL 0.5   Alkaline Phos 38 - 126 U/L 100   AST 15 - 41 U/L 24   ALT  0 - 44 U/L 11    Edinburgh Score:    01/04/2023    4:50 AM  Edinburgh Postnatal Depression Scale Screening Tool  I have been able to laugh and see the funny side of things. 0  I have looked forward with enjoyment to things. 0  I have blamed myself unnecessarily when things went wrong. 2  I have been anxious or worried for no good reason. 1  I have felt scared or panicky for no good reason. 2  Things have been getting on top of me. 1  I have been so unhappy that I have had difficulty sleeping. 1  I have felt sad or miserable. 1  I have been so unhappy that I have been crying. 0  The thought of harming myself has occurred to me. 0  Edinburgh Postnatal Depression Scale Total 8      After visit meds:  Allergies as of 01/04/2023   No Known Allergies       Medication List     STOP taking these medications    calcium carbonate 500 MG chewable tablet Commonly known as: TUMS - dosed in mg elemental calcium       TAKE these medications    ibuprofen 600 MG tablet Commonly known as: ADVIL Take 1 tablet (600 mg total) by mouth every 6 (six) hours.   labetalol 100 MG tablet Commonly known as: NORMODYNE Take 1 tablet (100 mg total) by mouth 2 (two) times daily.   oxyCODONE 5 MG immediate release tablet Commonly known as: Oxy IR/ROXICODONE Take 1 tablet (5 mg total) by mouth every 6 (six) hours as needed for severe pain.   prenatal multivitamin Tabs tablet Take 1 tablet by mouth daily at 12 noon.   sertraline 50 MG tablet Commonly known as: Zoloft Take 1 tablet (50 mg total) by mouth daily. Take half a tablet at bedtime for 7-10 days, then increase to 1 tablet at bedtime               Discharge Care Instructions  (From admission, onward)           Start     Ordered   01/04/23 0000  Leave dressing on - Keep it clean, dry, and intact until clinic visit        01/04/23 1410             Discharge home in stable condition Infant Feeding: Bottle and Breast Infant Disposition:NICU Discharge instruction: per After Visit Summary and Postpartum booklet. Activity: Advance as tolerated. Pelvic rest for 6 weeks.  Diet: routine diet Anticipated Birth Control: Unsure Postpartum Appointment:1 week Additional Postpartum F/U: Incision check 1 week Future Appointments: Future Appointments  Date Time Provider [place]  01/06/2023  3:00 PM Harlin Heys, MD [facility] None  01/21/2023  9:00 AM [facility] None   Follow up Visit:  She is provided Rx for roxicodone. She will take daily iron tabs and use Colace PRN A prescription for Labetalol 100 mg po BID has been ordered for her. Instructed to call the [place] [facility] office and have an incision check and BP check in one week.  Imagene Riches,  CNM  01/04/2023 2:21 PM       01/04/2023 Imagene Riches, CNM

## 2023-01-04 NOTE — Progress Notes (Signed)
Patient discharged home with family.  Discharge instructions, when to follow up, and prescriptions reviewed with patient.  Patient verbalized understanding. Patient will be escorted out by auxiliary.   

## 2023-01-04 NOTE — Lactation Note (Signed)
This note was copied from a baby's chart. Lactation Consultation Note  Patient Name: Mia Williams [id number] Date: 01/04/2023 Reason for consult: Follow-up assessment;Maternal discharge;NICU baby;Preterm <34wks;Infant < 6lbs Age:36 hours  Maternal Data Has patient been taught Hand Expression?: Yes Does the patient have breastfeeding experience prior to this delivery?: No  Mom planning discharge today, pumping frequently, feeling confident with use of DEBP and has 2 options to use while at home.  Feeding Mother's Current Feeding Choice: Breast Milk  Lactation Tools Discussed/Used Tools: Pump Breast pump type: Double-Electric Breast Pump Reason for Pumping: 33wk; preterm; NICU Pumping frequency: q 3 hours Pumped volume: 60 mL  Interventions Interventions: DEBP;Education  Encouraged ongoing frequency of pumping, hands on pumping, hand expression pre/post pumping for optimal milk removal. Reviewed storage guidelines and transportation guidelines  Discharge Discharge Education: Engorgement and breast care;Outpatient recommendation Pump: Personal (motif; medela)  Outpatient lactation number provided. Mom also encouraged to ask for lactation assistance as needed while visiting.  Consult Status Consult Status: NICU follow-up    Lavonia Drafts 01/04/2023, 9:35 AM

## 2023-01-06 ENCOUNTER — Encounter: Payer: BC Managed Care – PPO | Admitting: Obstetrics and Gynecology

## 2023-01-06 DIAGNOSIS — Z3A34 34 weeks gestation of pregnancy: Secondary | ICD-10-CM

## 2023-01-06 DIAGNOSIS — O0993 Supervision of high risk pregnancy, unspecified, third trimester: Secondary | ICD-10-CM

## 2023-01-07 ENCOUNTER — Other Ambulatory Visit: Payer: BC Managed Care – PPO

## 2023-01-14 ENCOUNTER — Other Ambulatory Visit: Payer: BC Managed Care – PPO

## 2023-01-14 ENCOUNTER — Ambulatory Visit (INDEPENDENT_AMBULATORY_CARE_PROVIDER_SITE_OTHER): Payer: BC Managed Care – PPO | Admitting: Obstetrics and Gynecology

## 2023-01-14 ENCOUNTER — Encounter: Payer: Self-pay | Admitting: Obstetrics and Gynecology

## 2023-01-14 VITALS — BP 119/78 | HR 84 | Ht 63.0 in | Wt 167.8 lb

## 2023-01-14 DIAGNOSIS — Z9889 Other specified postprocedural states: Secondary | ICD-10-CM

## 2023-01-14 NOTE — Progress Notes (Signed)
Patient presents today for 1 week postpartum follow-up. She states over the past few days occasional headaches and fatigue, still taking labetalol once daily. Patient had a cesarean delivery on 01/02/23.  She is currently pumping while baby is still in the NICU. She states she would like an oral pill for birth control. EPDS score of 10 .

## 2023-01-14 NOTE — Progress Notes (Signed)
HPI:      [name] is a 37 y.o. [id number] who LMP was Patient's last menstrual period was 05/11/2022 (exact date).  Subjective:   She presents today 1 week from cesarean delivery.  She is doing very well.  She reports she has minimal pain sometimes at the end of the day.  She continues to take labetalol 100 mg/day.  She does have occasional headaches.  Reports light vaginal bleeding. Her baby remains in the NICU.  He is doing well and exceeding expectations at this time.  She is pumping and they are using her breastmilk for feedings.    Hx: The following portions of the patient's history were reviewed and updated as appropriate:             She  has a past medical history of Family history of ovarian cancer, History of normal Holter exam, and Supervision of other high risk pregnancies, second trimester (12/13/2020). She does not have any pertinent problems on file. She  has a past surgical history that includes Tonsillectomy; Foreign Body Removal; Dilation and evacuation (N/A, 07/20/2019); cesarean section (05/24/2021); and Cesarean section (N/A, 01/02/2023). Her family history includes Ovarian cancer in her paternal grandmother; Pancreatic cancer in her paternal grandfather. She  reports that she has never smoked. She has never used smokeless tobacco. She reports that she does not currently use alcohol. She reports that she does not use drugs. She has a current medication list which includes the following prescription(s): ibuprofen, labetalol, prenatal multivitamin, and sertraline. She has No Known Allergies.       Review of Systems:  Review of Systems  Constitutional: Denied constitutional symptoms, night sweats, recent illness, fatigue, fever, insomnia and weight loss.  Eyes: Denied eye symptoms, eye pain, photophobia, vision change and visual disturbance.  Ears/Nose/Throat/Neck: Denied ear, nose, throat or neck symptoms, hearing loss, nasal discharge, sinus congestion and sore throat.   Cardiovascular: Denied cardiovascular symptoms, arrhythmia, chest pain/pressure, edema, exercise intolerance, orthopnea and palpitations.  Respiratory: Denied pulmonary symptoms, asthma, pleuritic pain, productive sputum, cough, dyspnea and wheezing.  Gastrointestinal: Denied, gastro-esophageal reflux, melena, nausea and vomiting.  Genitourinary: Denied genitourinary symptoms including symptomatic vaginal discharge, pelvic relaxation issues, and urinary complaints.  Musculoskeletal: Denied musculoskeletal symptoms, stiffness, swelling, muscle weakness and myalgia.  Dermatologic: Denied dermatology symptoms, rash and scar.  Neurologic: Denied neurology symptoms, dizziness, headache, neck pain and syncope.  Psychiatric: Denied psychiatric symptoms, anxiety and depression.  Endocrine: Denied endocrine symptoms including hot flashes and night sweats.   Meds:   Current Outpatient Medications on File Prior to Visit  Medication Sig Dispense Refill   ibuprofen (ADVIL) 600 MG tablet Take 1 tablet (600 mg total) by mouth every 6 (six) hours. 30 tablet 0   labetalol (NORMODYNE) 100 MG tablet Take 1 tablet (100 mg total) by mouth 2 (two) times daily. 60 tablet 1   Prenatal Vit-Fe Fumarate-FA (PRENATAL MULTIVITAMIN) TABS tablet Take 1 tablet by mouth daily at 12 noon.     sertraline (ZOLOFT) 50 MG tablet Take 1 tablet (50 mg total) by mouth daily. Take half a tablet at bedtime for 7-10 days, then increase to 1 tablet at bedtime 30 tablet 1   No current facility-administered medications on file prior to visit.      Objective:     Vitals:   01/14/23 1049  BP: 119/78  Pulse: 84   Filed Weights   01/14/23 1049  Weight: 167 lb 12.8 oz (76.1 kg)  Abdomen: Soft.  Non-tender.  No masses.  No HSM.  Incision/s: Intact.  Healing well.  No erythema.  No drainage.             Assessment:    [id number] Patient Active Problem List   Diagnosis Date Noted   Postpartum care following  cesarean delivery 01/04/2023   Elevated blood pressure affecting pregnancy in third trimester, antepartum 01/01/2023   Preeclampsia, third trimester 01/01/2023   Labor and delivery, indication for care 12/31/2022   Abnormal O'Sullivan glucose challenge test, antepartum 12/23/2022   Supervision of other normal pregnancy, antepartum 07/27/2022   History of cesarean section 05/27/2021   Anxiety 05/05/2021     1. Postoperative state   2. Postpartum care following cesarean delivery     Patient with excellent recovery postop  Blood pressure is excellent I do not believe she needs the labetalol any longer   Plan:            1.  Discontinue labetalol  2.  Iron once daily till 6 weeks  3.  Wound care discussed. Orders No orders of the defined types were placed in this encounter.   No orders of the defined types were placed in this encounter.     F/U  Return in about 5 weeks (around 02/18/2023).  Finis Bud, M.D. 01/14/2023 11:07 AM

## 2023-01-15 ENCOUNTER — Ambulatory Visit: Payer: Self-pay

## 2023-01-15 NOTE — Lactation Note (Signed)
This note was copied from a baby's chart. Lactation Consultation Note  Patient Name: Mia Williams [id number] Date: 01/15/2023   Age:36 days  Met with mom today as requested by care nurse. Per mom she would like assistance with breastfeeding for the 12 noon feedings beginning Monday. She will be able to drive and plans on coming each day for the 12 noon feeding. LC to follow-up with mom on Monday for 12 noon feeding.  Update provided to care nurse.   Jonna Arra Connaughton 01/15/2023, 6:31 PM

## 2023-01-21 ENCOUNTER — Other Ambulatory Visit: Payer: BC Managed Care – PPO

## 2023-01-25 ENCOUNTER — Inpatient Hospital Stay: Admit: 2023-01-25 | Payer: BC Managed Care – PPO | Admitting: Obstetrics and Gynecology

## 2023-01-26 ENCOUNTER — Other Ambulatory Visit: Payer: Self-pay | Admitting: Obstetrics

## 2023-01-28 ENCOUNTER — Other Ambulatory Visit: Payer: BC Managed Care – PPO

## 2023-02-17 ENCOUNTER — Ambulatory Visit (INDEPENDENT_AMBULATORY_CARE_PROVIDER_SITE_OTHER): Payer: BC Managed Care – PPO | Admitting: Obstetrics and Gynecology

## 2023-02-17 ENCOUNTER — Encounter: Payer: Self-pay | Admitting: Obstetrics and Gynecology

## 2023-02-17 VITALS — BP 114/79 | HR 87 | Ht 63.0 in | Wt 162.8 lb

## 2023-02-17 DIAGNOSIS — Z9889 Other specified postprocedural states: Secondary | ICD-10-CM

## 2023-02-17 MED ORDER — DESOGESTREL-ETHINYL ESTRADIOL 0.15-0.02/0.01 MG (21/5) PO TABS: 1.0000 | ORAL_TABLET | Freq: Every day | ORAL | 1 refills | Status: DC

## 2023-02-17 NOTE — Progress Notes (Signed)
HPI:      [name] is a 37 y.o. [id number] who LMP was Patient's last menstrual period was 02/06/2023.  Subjective:   She presents today 6 weeks postop/postpartum from cesarean delivery.  Her delivery was preterm and her baby is now home.  She is pumping and bottlefeeding and supplementing.  She desires OCPs for birth control.  She has resumed intercourse without issue. Zoloft 100 mg going well.    Hx: The following portions of the patient's history were reviewed and updated as appropriate:             She  has a past medical history of Family history of ovarian cancer, History of normal Holter exam, and Supervision of other high risk pregnancies, second trimester (12/13/2020). She does not have any pertinent problems on file. She  has a past surgical history that includes Tonsillectomy; Foreign Body Removal; Dilation and evacuation (N/A, 07/20/2019); cesarean section (05/24/2021); and Cesarean section (N/A, 01/02/2023). Her family history includes Ovarian cancer in her paternal grandmother; Pancreatic cancer in her paternal grandfather. She  reports that she has never smoked. She has never used smokeless tobacco. She reports that she does not currently use alcohol. She reports that she does not use drugs. She has a current medication list which includes the following prescription(s): desogestrel-ethinyl estradiol, sertraline, and prenatal multivitamin. She has No Known Allergies.       Review of Systems:  Review of Systems  Constitutional: Denied constitutional symptoms, night sweats, recent illness, fatigue, fever, insomnia and weight loss.  Eyes: Denied eye symptoms, eye pain, photophobia, vision change and visual disturbance.  Ears/Nose/Throat/Neck: Denied ear, nose, throat or neck symptoms, hearing loss, nasal discharge, sinus congestion and sore throat.  Cardiovascular: Denied cardiovascular symptoms, arrhythmia, chest pain/pressure, edema, exercise intolerance, orthopnea and  palpitations.  Respiratory: Denied pulmonary symptoms, asthma, pleuritic pain, productive sputum, cough, dyspnea and wheezing.  Gastrointestinal: Denied, gastro-esophageal reflux, melena, nausea and vomiting.  Genitourinary: Denied genitourinary symptoms including symptomatic vaginal discharge, pelvic relaxation issues, and urinary complaints.  Musculoskeletal: Denied musculoskeletal symptoms, stiffness, swelling, muscle weakness and myalgia.  Dermatologic: Denied dermatology symptoms, rash and scar.  Neurologic: Denied neurology symptoms, dizziness, headache, neck pain and syncope.  Psychiatric: Denied psychiatric symptoms, anxiety and depression.  Endocrine: Denied endocrine symptoms including hot flashes and night sweats.   Meds:   Current Outpatient Medications on File Prior to Visit  Medication Sig Dispense Refill   sertraline (ZOLOFT) 50 MG tablet Take 1 tablet (50 mg total) by mouth daily. Take half a tablet at bedtime for 7-10 days, then increase to 1 tablet at bedtime 30 tablet 1   Prenatal Vit-Fe Fumarate-FA (PRENATAL MULTIVITAMIN) TABS tablet Take 1 tablet by mouth daily at 12 noon. (Patient not taking: Reported on 02/17/2023)     No current facility-administered medications on file prior to visit.      Objective:     Vitals:   02/17/23 1101  BP: 114/79  Pulse: 87   Filed Weights   02/17/23 1101  Weight: 162 lb 12.8 oz (73.8 kg)               Abdomen: Soft.  Non-tender.  No masses.  No HSM.  Incision/s: Intact.  Healing well.  No erythema.  No drainage.             Assessment:    [id number] Patient Active Problem List   Diagnosis Date Noted   Postpartum care following cesarean delivery 01/04/2023   Elevated blood pressure affecting  pregnancy in third trimester, antepartum 01/01/2023   Preeclampsia, third trimester 01/01/2023   Labor and delivery, indication for care 12/31/2022   Abnormal O'Sullivan glucose challenge test, antepartum 12/23/2022   Supervision  of other normal pregnancy, antepartum 07/27/2022   History of cesarean section 05/27/2021   Anxiety 05/05/2021     1. Postoperative state   2. Postpartum care following cesarean delivery     Excellent recovery postop and postpartum.   Plan:            1.  May resume normal activities with the exception of heavy lifting.   2.  Breastfeeding and Birth Control Breastfeeding and birth control were discussed in detail.  The patient understands that breastfeeding is not a reliable form of birth control.  We have discussed the use of Progesterone-only birth control pills and combination OCPs.  I have informed her that Progesterone only pills are safe with breastfeeding and very effective when used in combination with full-time breastfeeding.  I have stressed that when she begins to wean from breastfeeding, Progesterone-only pills become less effective and switching to another form of birth control is recommended.  We have also discussed the use of combination OCPs with breastfeeding.  I have informed her that OCPs are safe with breastfeeding even though a small amount of the drug is carried in breast milk.  We have discussed the fact that combination OCPs can reduce the amount of breast milk produced.  I have made her aware that in rare instances this can lead to discontinuation of breastfeeding.  The use of Mirena IUD was discussed and she has been made aware that this is safe with breastfeeding.  All her questions were answered and she was given appropriate literature on birth control methods.  She has elected to begin combination OCPs.  Will start Mircette.  Orders No orders of the defined types were placed in this encounter.   No orders of the defined types were placed in this encounter.     F/U  Return in about 3 months (around 05/20/2023) for Annual Physical.  Finis Bud, M.D. 02/17/2023 11:25 AM

## 2023-02-17 NOTE — Progress Notes (Signed)
Patient presents today for 6 week postpartum follow-up. Patient had a cesarean delivery on 01/02/23. She is pumping and bottle feeding. She states she would like OCP's for birth control. EPDS score of 7, continuing to take Zoloft daily. She states no other questions or concerns at this time.

## 2023-03-03 ENCOUNTER — Telehealth: Payer: Self-pay

## 2023-03-03 NOTE — Telephone Encounter (Signed)
[facility]- Discharge Call Backs-Left pt a VM about the following below. 1-Do you have any questions or concerns about yourself as you heal? 2-Any concerns or questions about your baby? 4-How was your stay at the hospital? 5-How did our team work together to care for you? You should be receiving a survey in the mail soon.   We would really appreciate it if you could fill that out for [place] and return it in the mail.  We value the feedback to make improvements and continue the great work we do.   If you have any questions please feel free to call me back at [phone]

## 2023-03-06 ENCOUNTER — Other Ambulatory Visit: Payer: Self-pay

## 2023-04-13 ENCOUNTER — Other Ambulatory Visit: Payer: Self-pay | Admitting: Obstetrics

## 2023-06-28 ENCOUNTER — Telehealth: Payer: BC Managed Care – PPO | Admitting: Physician Assistant

## 2023-06-28 DIAGNOSIS — R3989 Other symptoms and signs involving the genitourinary system: Secondary | ICD-10-CM

## 2023-06-28 MED ORDER — CEPHALEXIN 500 MG PO CAPS: 500.0000 mg | ORAL_CAPSULE | Freq: Two times a day (BID) | ORAL | 0 refills | Status: AC

## 2023-06-28 NOTE — Progress Notes (Signed)
E-Visit for Urinary Problems  Thank you for clarifying for me.   We are sorry that you are not feeling well.  Here is how we plan to help!  Based on what you shared with me it looks like you most likely have a simple urinary tract infection.  A UTI (Urinary Tract Infection) is a bacterial infection of the bladder.  Most cases of urinary tract infections are simple to treat but a key part of your care is to encourage you to drink plenty of fluids and watch your symptoms carefully.  I have prescribed Keflex 500 mg twice a day for 7 days.  Your symptoms should gradually improve. Call us if the burning in your urine worsens, you develop worsening fever, back pain or pelvic pain or if your symptoms do not resolve after completing the antibiotic.  Urinary tract infections can be prevented by drinking plenty of water to keep your body hydrated.  Also be sure when you wipe, wipe from front to back and don't hold it in!  If possible, empty your bladder every 4 hours.  HOME CARE Drink plenty of fluids Compete the full course of the antibiotics even if the symptoms resolve Remember, when you need to go.go. Holding in your urine can increase the likelihood of getting a UTI! GET HELP RIGHT AWAY IF: You cannot urinate You get a high fever Worsening back pain occurs You see blood in your urine You feel sick to your stomach or throw up You feel like you are going to pass out  MAKE SURE YOU  Understand these instructions. Will watch your condition. Will get help right away if you are not doing well or get worse.   Thank you for choosing an e-visit.  Your e-visit answers were reviewed by a board certified advanced clinical practitioner to complete your personal care plan. Depending upon the condition, your plan could have included both over the counter or prescription medications.  Please review your pharmacy choice. Make sure the pharmacy is open so you can pick up prescription now. If there is  a problem, you may contact your provider through [employer] and have the prescription routed to another pharmacy.  Your safety is important to [place]. If you have drug allergies check your prescription carefully.   For the next 24 hours you can use MyChart to ask questions about today's visit, request a non-urgent call back, or ask for a work or school excuse. You will get an email in the next two days asking about your experience. I hope that your e-visit has been valuable and will speed your recovery.

## 2023-06-28 NOTE — Progress Notes (Signed)
Message sent to patient requesting further input regarding current symptoms. Awaiting patient response.  

## 2023-06-28 NOTE — Progress Notes (Signed)
I have spent 5 minutes in review of e-visit questionnaire, review and updating patient chart, medical decision making and response to patient.   William Cody Martin, PA-C    

## 2023-08-12 ENCOUNTER — Other Ambulatory Visit: Payer: Self-pay | Admitting: Obstetrics and Gynecology

## 2023-10-16 ENCOUNTER — Telehealth: Payer: BC Managed Care – PPO | Admitting: Family Medicine

## 2023-10-16 DIAGNOSIS — R6889 Other general symptoms and signs: Secondary | ICD-10-CM | POA: Diagnosis not present

## 2023-10-16 MED ORDER — OSELTAMIVIR PHOSPHATE 75 MG PO CAPS: 75.0000 mg | ORAL_CAPSULE | Freq: Two times a day (BID) | ORAL | 0 refills | Status: AC

## 2023-10-16 NOTE — Progress Notes (Signed)
 E visit for Flu like symptoms   We are sorry that you are not feeling well.  Here is how we plan to help! Based on what you have shared with me it looks like you may have a respiratory virus that may be influenza.  Influenza or "the flu" is   an infection caused by a respiratory virus. The flu virus is highly contagious and persons who did not receive their yearly flu vaccination may "catch" the flu from close contact.  We have anti-viral medications to treat the viruses that cause this infection. They are not a "cure" and only shorten the course of the infection. These prescriptions are most effective when they are given within the first 2 days of "flu" symptoms. Antiviral medication are indicated if you have a high risk of complications from the flu. You should  also consider an antiviral medication if you are in close contact with someone who is at risk. These medications can help patients avoid complications from the flu  but have side effects that you should know. Possible side effects from Tamiflu or oseltamivir include nausea, vomiting, diarrhea, dizziness, headaches, eye redness, sleep problems or other respiratory symptoms. You should not take Tamiflu if you have an allergy to oseltamivir or any to the ingredients in Tamiflu.  Based upon your symptoms and potential risk factors I have prescribed Oseltamivir (Tamiflu).  It has been sent to your designated pharmacy.  You will take one 75 mg capsule orally twice a day for the next 5 days.  ANYONE WHO HAS FLU SYMPTOMS SHOULD: Stay home. The flu is highly contagious and going out or to work exposes others! Be sure to drink plenty of fluids. Water is fine as well as fruit juices, sodas and electrolyte beverages. You may want to stay away from caffeine or alcohol. If you are nauseated, try taking small sips of liquids. How do you know if you are getting enough fluid? Your urine should be a pale yellow or almost colorless. Get rest. Taking a steamy  shower or using a humidifier may help nasal congestion and ease sore throat pain. Using a saline nasal spray works much the same way. Cough drops, hard candies and sore throat lozenges may ease your cough. Line up a caregiver. Have someone check on you regularly.   GET HELP RIGHT AWAY IF: You cannot keep down liquids or your medications. You become short of breath Your fell like you are going to pass out or loose consciousness. Your symptoms persist after you have completed your treatment plan MAKE SURE YOU  Understand these instructions. Will watch your condition. Will get help right away if you are not doing well or get worse.  Your e-visit answers were reviewed by a board certified advanced clinical practitioner to complete your personal care plan.  Depending on the condition, your plan could have included both over the counter or prescription medications.  If there is a problem please reply  once you have received a response from your provider.  Your safety is important to [place].  If you have drug allergies check your prescription carefully.    You can use MyChart to ask questions about today's visit, request a non-urgent call back, or ask for a work or school excuse for 24 hours related to this e-Visit. If it has been greater than 24 hours you will need to follow up with your provider, or enter a new e-Visit to address those concerns.  You will get an e-mail in the next  two days asking about your experience.  I hope that your e-visit has been valuable and will speed your recovery. Thank you for using e-visits.  I have spent 5 minutes in review of e-visit questionnaire, review and updating patient chart, medical decision making and response to patient.   Reed Pandy, PA-C

## 2023-10-17 ENCOUNTER — Other Ambulatory Visit: Payer: Self-pay | Admitting: Obstetrics

## 2023-10-18 ENCOUNTER — Telehealth: Payer: BC Managed Care – PPO | Admitting: Physician Assistant

## 2023-10-18 DIAGNOSIS — B999 Unspecified infectious disease: Secondary | ICD-10-CM

## 2023-10-18 DIAGNOSIS — H6503 Acute serous otitis media, bilateral: Secondary | ICD-10-CM

## 2023-10-18 MED ORDER — AMOXICILLIN-POT CLAVULANATE 875-125 MG PO TABS: 1.0000 | ORAL_TABLET | Freq: Two times a day (BID) | ORAL | 0 refills | Status: AC

## 2023-10-18 NOTE — Progress Notes (Signed)

## 2024-04-18 ENCOUNTER — Other Ambulatory Visit: Payer: Self-pay | Admitting: Obstetrics

## 2024-06-02 ENCOUNTER — Other Ambulatory Visit (HOSPITAL_COMMUNITY): Payer: Self-pay
